# Patient Record
Sex: Male | Born: 1970
Health system: Southern US, Community
[De-identification: ages and names within clinical notes are randomized; demographics above are authoritative.]

## PROBLEM LIST (undated history)

## (undated) DIAGNOSIS — I10 Essential (primary) hypertension: Secondary | ICD-10-CM

## (undated) HISTORY — DX: Essential (primary) hypertension: I10

---

## 2014-11-13 DIAGNOSIS — I10 Essential (primary) hypertension: Secondary | ICD-10-CM | POA: Insufficient documentation

## 2014-11-13 DIAGNOSIS — F339 Major depressive disorder, recurrent, unspecified: Secondary | ICD-10-CM | POA: Insufficient documentation

## 2016-01-17 DIAGNOSIS — E669 Obesity, unspecified: Secondary | ICD-10-CM | POA: Insufficient documentation

## 2016-01-17 DIAGNOSIS — E66811 Obesity, class 1: Secondary | ICD-10-CM | POA: Insufficient documentation

## 2016-01-17 DIAGNOSIS — E538 Deficiency of other specified B group vitamins: Secondary | ICD-10-CM | POA: Insufficient documentation

## 2017-08-14 DIAGNOSIS — E782 Mixed hyperlipidemia: Secondary | ICD-10-CM | POA: Insufficient documentation

## 2019-05-30 DIAGNOSIS — R7989 Other specified abnormal findings of blood chemistry: Secondary | ICD-10-CM | POA: Diagnosis not present

## 2019-05-30 DIAGNOSIS — I10 Essential (primary) hypertension: Secondary | ICD-10-CM | POA: Diagnosis not present

## 2019-05-30 DIAGNOSIS — E7800 Pure hypercholesterolemia, unspecified: Secondary | ICD-10-CM | POA: Diagnosis not present

## 2019-05-30 DIAGNOSIS — F320 Major depressive disorder, single episode, mild: Secondary | ICD-10-CM | POA: Diagnosis not present

## 2019-05-30 DIAGNOSIS — Z23 Encounter for immunization: Secondary | ICD-10-CM | POA: Diagnosis not present

## 2019-05-30 DIAGNOSIS — E538 Deficiency of other specified B group vitamins: Secondary | ICD-10-CM | POA: Diagnosis not present

## 2019-11-28 DIAGNOSIS — E538 Deficiency of other specified B group vitamins: Secondary | ICD-10-CM | POA: Diagnosis not present

## 2019-11-28 DIAGNOSIS — E7800 Pure hypercholesterolemia, unspecified: Secondary | ICD-10-CM | POA: Diagnosis not present

## 2019-11-28 DIAGNOSIS — E669 Obesity, unspecified: Secondary | ICD-10-CM | POA: Diagnosis not present

## 2019-11-28 DIAGNOSIS — I10 Essential (primary) hypertension: Secondary | ICD-10-CM | POA: Diagnosis not present

## 2019-11-28 DIAGNOSIS — R002 Palpitations: Secondary | ICD-10-CM | POA: Diagnosis not present

## 2019-11-28 DIAGNOSIS — H539 Unspecified visual disturbance: Secondary | ICD-10-CM | POA: Diagnosis not present

## 2019-11-29 DIAGNOSIS — I472 Ventricular tachycardia: Secondary | ICD-10-CM | POA: Diagnosis not present

## 2019-12-25 DIAGNOSIS — I472 Ventricular tachycardia: Secondary | ICD-10-CM | POA: Diagnosis not present

## 2019-12-25 DIAGNOSIS — R008 Other abnormalities of heart beat: Secondary | ICD-10-CM | POA: Diagnosis not present

## 2020-01-02 DIAGNOSIS — I493 Ventricular premature depolarization: Secondary | ICD-10-CM | POA: Insufficient documentation

## 2020-01-02 DIAGNOSIS — R002 Palpitations: Secondary | ICD-10-CM | POA: Diagnosis not present

## 2020-01-02 DIAGNOSIS — M79671 Pain in right foot: Secondary | ICD-10-CM | POA: Diagnosis not present

## 2020-04-23 DIAGNOSIS — U071 COVID-19: Secondary | ICD-10-CM | POA: Diagnosis not present

## 2020-04-23 DIAGNOSIS — R6883 Chills (without fever): Secondary | ICD-10-CM | POA: Diagnosis not present

## 2020-08-31 DIAGNOSIS — I10 Essential (primary) hypertension: Secondary | ICD-10-CM | POA: Diagnosis not present

## 2020-08-31 DIAGNOSIS — E7800 Pure hypercholesterolemia, unspecified: Secondary | ICD-10-CM | POA: Diagnosis not present

## 2020-08-31 DIAGNOSIS — E538 Deficiency of other specified B group vitamins: Secondary | ICD-10-CM | POA: Diagnosis not present

## 2020-09-16 DIAGNOSIS — H2513 Age-related nuclear cataract, bilateral: Secondary | ICD-10-CM | POA: Diagnosis not present

## 2020-09-16 DIAGNOSIS — H40033 Anatomical narrow angle, bilateral: Secondary | ICD-10-CM | POA: Diagnosis not present

## 2021-02-01 ENCOUNTER — Other Ambulatory Visit: Payer: Self-pay | Admitting: Family Medicine

## 2021-02-01 ENCOUNTER — Other Ambulatory Visit: Payer: Self-pay

## 2021-02-01 ENCOUNTER — Ambulatory Visit: Payer: BC Managed Care – PPO | Admitting: Family Medicine

## 2021-02-01 ENCOUNTER — Encounter: Payer: Self-pay | Admitting: Family Medicine

## 2021-02-01 VITALS — BP 124/81 | HR 63 | Temp 98.4°F | Ht 74.0 in | Wt 270.0 lb

## 2021-02-01 DIAGNOSIS — E782 Mixed hyperlipidemia: Secondary | ICD-10-CM

## 2021-02-01 DIAGNOSIS — Z23 Encounter for immunization: Secondary | ICD-10-CM | POA: Diagnosis not present

## 2021-02-01 DIAGNOSIS — L03031 Cellulitis of right toe: Secondary | ICD-10-CM

## 2021-02-01 DIAGNOSIS — E669 Obesity, unspecified: Secondary | ICD-10-CM

## 2021-02-01 DIAGNOSIS — E538 Deficiency of other specified B group vitamins: Secondary | ICD-10-CM

## 2021-02-01 DIAGNOSIS — E66811 Obesity, class 1: Secondary | ICD-10-CM

## 2021-02-01 DIAGNOSIS — E8881 Metabolic syndrome: Secondary | ICD-10-CM | POA: Diagnosis not present

## 2021-02-01 DIAGNOSIS — I10 Essential (primary) hypertension: Secondary | ICD-10-CM | POA: Diagnosis not present

## 2021-02-01 DIAGNOSIS — S161XXA Strain of muscle, fascia and tendon at neck level, initial encounter: Secondary | ICD-10-CM

## 2021-02-01 MED ORDER — MUPIROCIN CALCIUM 2 % EX CREA: 1.0000 "application " | TOPICAL_CREAM | Freq: Two times a day (BID) | CUTANEOUS | 0 refills | Status: DC

## 2021-02-01 MED ORDER — OZEMPIC (0.25 OR 0.5 MG/DOSE) 2 MG/1.5ML ~~LOC~~ SOPN: 0.2500 mg | PEN_INJECTOR | SUBCUTANEOUS | 0 refills | Status: AC

## 2021-02-01 MED ORDER — PREDNISONE 20 MG PO TABS: 40.0000 mg | ORAL_TABLET | Freq: Every day | ORAL | 0 refills | Status: AC

## 2021-02-01 MED ORDER — LISINOPRIL-HYDROCHLOROTHIAZIDE 20-12.5 MG PO TABS: 2.0000 | ORAL_TABLET | Freq: Every day | ORAL | 1 refills | Status: DC

## 2021-02-01 NOTE — Patient Instructions (Signed)

## 2021-02-01 NOTE — Progress Notes (Signed)
Subjective:  Patient ID: Spencer Anderson, male    DOB: [birth year], 50 y.o.   MRN: [id number]  Patient Care Team: Baruch Gouty, FNP as PCP - General (Family Medicine)   Chief Complaint:  New Patient (Initial Visit) (Blood pressure Ferrel Logan toenail/Back, neck, shoulder pain)   HPI: Spencer Anderson is a 50 y.o. male presenting on 02/01/2021 for New Patient (Initial Visit) (Blood pressure Ferrel Logan toenail/Back, neck, shoulder pain)  Patient is a 50 year old Caucasian male who presents today to establish care with new PCP.  He was formally followed by [facility].  He states he had a disagreement with them in May of this year due to wanting to draw lab work. His primary care provider wanted to check renal function due to hypertension and medications.  Patient felt that his blood work should only be checked once a year at most. He did not agree with need for labs so he changed providers.  Patient aware lab work would be required for maintenance of chronic medications and chronic medical conditions at this office as well.  Patient verbalizes understanding and agrees to lab work today.  Patient has hypertension which has been well controlled on lisinopril/HCTZ.  He denies any chest pain, leg swelling, headaches, visual disturbances, dizziness, or confusion.  He does take medicines as prescribed.  He also has B12 deficiency and is on oral repletion therapy.  He was on injectable repletion therapy in the past but felt oral was sufficient.  He has a cholesterol as noted elevated on prior labs, he is not on medication for this and denies history of hyperlipidemia.  He is obese, hypertensive, and has elevated LDL at 140 consistent with metabolic syndrome.  He normally comes for management of chronic medical conditions he also complains of left lateral neck and left shoulder pain.  He states that he was helping his daughter move a dresser 2 to 3 months ago and pulled something in his shoulder and  neck area.  He states his neck feels tight and at times he has tingling sensations in his arm.  He did go to a chiropractor for about a month for symptoms, states this was not beneficial so he stopped going.  He has been taking Tylenol and Advil with some relief of symptoms.  No loss of function or decreased range of motion.  He also has a complaint of an ingrown toenail.  States this is a recurrent problem for him, this 1 has been present for over a month.  He has tried to cut the toenail out without success.  He has not been soaking the toe or using antibiotic ointment.   Relevant past medical, surgical, family, and social history reviewed and updated as indicated.  Allergies and medications reviewed and updated. Data reviewed: Chart in Epic.   Past Medical History:  Diagnosis Date   Hypertension     History reviewed. No pertinent surgical history.  Social History   Socioeconomic History   Marital status: Divorced    Spouse name: Not on file   Number of children: Not on file   Years of education: Not on file   Highest education level: Not on file  Occupational History   Not on file  Tobacco Use   Smoking status: Former    Packs/day: 1.00    Years: 22.00    Pack years: 22.00    Types: Cigarettes    Quit date: 01/12/2008    Years since quitting: 13.0    Passive  exposure: Past   Smokeless tobacco: Never  Vaping Use   Vaping Use: Never used  Substance and Sexual Activity   Alcohol use: Not Currently   Drug use: Never   Sexual activity: Not Currently  Other Topics Concern   Not on file  Social History Narrative   Not on file   Social Determinants of Health   Financial Resource Strain: Not on file  Food Insecurity: Not on file  Transportation Needs: Not on file  Physical Activity: Not on file  Stress: Not on file  Social Connections: Not on file  Intimate Partner Violence: Not on file    Outpatient Encounter Medications as of 02/01/2021  Medication Sig    cyanocobalamin 1000 MCG tablet Take by mouth.   mupirocin cream (BACTROBAN) 2 % Apply 1 application topically 2 (two) times daily.   predniSONE (DELTASONE) 20 MG tablet Take 2 tablets (40 mg total) by mouth daily with breakfast for 5 days.   Semaglutide,0.25 or 0.5MG/DOS, (OZEMPIC, 0.25 OR 0.5 MG/DOSE,) 2 MG/1.5ML SOPN Inject 0.25 mg into the skin once a week for 4 doses.   [DISCONTINUED] cyanocobalamin (,VITAMIN B-12,) 1000 MCG/ML injection Inject into the muscle.   [DISCONTINUED] lisinopril-hydrochlorothiazide (ZESTORETIC) 20-12.5 MG tablet Take 2 tablets by mouth daily.   lisinopril-hydrochlorothiazide (ZESTORETIC) 20-12.5 MG tablet Take 2 tablets by mouth daily.   No facility-administered encounter medications on file as of 02/01/2021.    No Known Allergies  Review of Systems  Constitutional:  Negative for activity change, appetite change, chills, diaphoresis, fatigue, fever and unexpected weight change.  HENT: Negative.    Eyes: Negative.  Negative for photophobia and visual disturbance.  Respiratory:  Negative for cough, chest tightness and shortness of breath.   Cardiovascular:  Negative for chest pain, palpitations and leg swelling.  Gastrointestinal:  Negative for abdominal pain, blood in stool, constipation, diarrhea, nausea and vomiting.  Endocrine: Negative.   Genitourinary:  Negative for decreased urine volume, difficulty urinating, dysuria, frequency and urgency.  Musculoskeletal:  Positive for arthralgias, myalgias, neck pain and neck stiffness. Negative for back pain, gait problem and joint swelling.  Skin:  Positive for color change. Negative for pallor and rash.  Allergic/Immunologic: Negative.   Neurological:  Negative for dizziness, tremors, seizures, syncope, facial asymmetry, speech difficulty, weakness, light-headedness, numbness and headaches.  Hematological: Negative.   Psychiatric/Behavioral:  Negative for confusion, hallucinations, sleep disturbance and suicidal  ideas.   All other systems reviewed and are negative.      Objective:  BP 124/81   Pulse 63   Temp 98.4 F (36.9 C)   Ht _0  (1.88 m)   Wt 270 lb (122.5 kg)   SpO2 95%   BMI 34.67 kg/m    Wt Readings from Last 3 Encounters:  02/01/21 270 lb (122.5 kg)    Physical Exam Vitals and nursing note reviewed.  Constitutional:      General: He is not in acute distress.    Appearance: Normal appearance. He is well-developed and well-groomed. He is morbidly obese. He is not ill-appearing, toxic-appearing or diaphoretic.  HENT:     Head: Normocephalic and atraumatic.     Jaw: There is normal jaw occlusion.     Right Ear: Hearing normal.     Left Ear: Hearing normal.     Nose: Nose normal.     Mouth/Throat:     Lips: Pink.     Mouth: Mucous membranes are moist.     Pharynx: Oropharynx is clear. Uvula midline.  Eyes:  General: Lids are normal.     Extraocular Movements: Extraocular movements intact.     Conjunctiva/sclera: Conjunctivae normal.     Pupils: Pupils are equal, round, and reactive to light.  Neck:     Thyroid: No thyroid mass, thyromegaly or thyroid tenderness.     Vascular: No carotid bruit or JVD.     Trachea: Trachea and phonation normal.  Cardiovascular:     Rate and Rhythm: Normal rate and regular rhythm.     Chest Wall: PMI is not displaced.     Pulses: Normal pulses.          Dorsalis pedis pulses are 2+ on the right side.       Posterior tibial pulses are 2+ on the right side.     Heart sounds: Normal heart sounds. No murmur heard.   No friction rub. No gallop.  Pulmonary:     Effort: Pulmonary effort is normal. No respiratory distress.     Breath sounds: Normal breath sounds. No wheezing.  Abdominal:     General: Bowel sounds are normal. There is no distension or abdominal bruit.     Palpations: Abdomen is soft. There is no hepatomegaly or splenomegaly.     Tenderness: There is no abdominal tenderness. There is no right CVA tenderness or left CVA  tenderness.     Hernia: No hernia is present.  Musculoskeletal:        General: No swelling, deformity or signs of injury. Normal range of motion.     Right shoulder: Normal.     Left shoulder: Normal.     Right upper arm: Normal.     Left upper arm: Normal.     Cervical back: Normal range of motion and neck supple. Spasms (left lateral neck) and tenderness (left laterall neck) present. No swelling, edema, deformity, erythema, signs of trauma, lacerations, rigidity, torticollis, bony tenderness or crepitus. Pain with movement (left lateral neck) present. Normal range of motion.     Thoracic back: Normal.       Back:     Right lower leg: No edema.     Left lower leg: No edema.     Right foot: Normal range of motion. No deformity, bunion, Charcot foot, foot drop or prominent metatarsal heads.       Feet:  Feet:     Right foot:     Skin integrity: Erythema and warmth present. No ulcer, blister, skin breakdown, callus, dry skin or fissure.     Toenail Condition: Right toenails are ingrown.  Lymphadenopathy:     Cervical: No cervical adenopathy.  Skin:    General: Skin is warm and dry.     Capillary Refill: Capillary refill takes less than 2 seconds.     Coloration: Skin is not cyanotic, jaundiced or pale.     Findings: No rash.  Neurological:     General: No focal deficit present.     Mental Status: He is alert and oriented to person, place, and time.     Cranial Nerves: No cranial nerve deficit.     Sensory: Sensation is intact. No sensory deficit.     Motor: Motor function is intact. No weakness.     Coordination: Coordination is intact. Coordination normal.     Gait: Gait is intact. Gait normal.     Deep Tendon Reflexes: Reflexes are normal and symmetric. Reflexes normal.  Psychiatric:        Attention and Perception: Attention and perception normal.  Mood and Affect: Mood and affect normal.        Speech: Speech normal.        Behavior: Behavior normal. Behavior is  cooperative.        Thought Content: Thought content normal.        Cognition and Memory: Cognition and memory normal.        Judgment: Judgment normal.    No results found for this or any previous visit.     Pertinent labs & imaging results that were available during my care of the patient were reviewed by me and considered in my medical decision making.  Assessment & Plan:  Nero was seen today for new patient (initial visit).  Diagnoses and all orders for this visit:  Essential hypertension BP well controlled. Changes were not made in regimen today. Goal BP is 140/90. Pt aware to report any persistent high or low readings. DASH diet and exercise encouraged. Exercise at least 150 minutes per week and increase as tolerated. Goal BMI > 25. Stress management encouraged. Avoid nicotine and tobacco product use. Avoid excessive alcohol and NSAID's. Avoid more than 2000 mg of sodium daily. Medications as prescribed. Follow up as scheduled. Labs pending.  Follow-up in 4 weeks for reevaluation. -     CBC with Differential/Platelet -     CMP14+EGFR -     Lipid panel -     Thyroid Panel With TSH -     Microalbumin / creatinine urine ratio -     Semaglutide,0.25 or 0.5MG/DOS, (OZEMPIC, 0.25 OR 0.5 MG/DOSE,) 2 MG/1.5ML SOPN; Inject 0.25 mg into the skin once a week for 4 doses. -     lisinopril-hydrochlorothiazide (ZESTORETIC) 20-12.5 MG tablet; Take 2 tablets by mouth daily.  Mixed hyperlipidemia We will recheck lipids today and if still elevated will start statin therapy.  Diet and exercise encouraged. -     CMP14+EGFR -     Lipid panel  Obesity, Class I, BMI 30-34.9 Diet and exercise encouraged.  Labs pending.  Will initiate Ozempic as prescribed for obesity and metabolic syndrome.  Follow-up in 4 weeks for reevaluation. -     CBC with Differential/Platelet -     CMP14+EGFR -     Lipid panel -     Thyroid Panel With TSH -     Semaglutide,0.25 or 0.5MG/DOS, (OZEMPIC, 0.25 OR 0.5  MG/DOSE,) 2 MG/1.5ML SOPN; Inject 0.25 mg into the skin once a week for 4 doses.  Metabolic syndrome Diet and exercise encouraged.  Labs pending.  Will initiate Ozempic for obesity and metabolic syndrome.  Follow-up in 4 weeks for reevaluation. -     CBC with Differential/Platelet -     CMP14+EGFR -     Lipid panel -     Thyroid Panel With TSH -     Semaglutide,0.25 or 0.5MG/DOS, (OZEMPIC, 0.25 OR 0.5 MG/DOSE,) 2 MG/1.5ML SOPN; Inject 0.25 mg into the skin once a week for 4 doses.  B12 deficiency Will check levels today to see if IM repletion therapy is warranted. -     CBC with Differential/Platelet -     Vitamin B12  Strain of cervical portion of left trapezius muscle Spasm noted to left trapezius muscle.  Symptomatic care discussed in detail.  Prednisone as prescribed.  Moist heat.  Topicals such as Biofreeze or icy hot.  Strengthening and stretching exercises discussed.  Report any new, worsening, or persistent symptoms. -     predniSONE (DELTASONE) 20 MG tablet; Take 2 tablets (  40 mg total) by mouth daily with breakfast for 5 days.  Paronychia of great toe, right Will treat with soaks and topical Bactroban.  Patient to return to office for toenail removal if symptoms persist. -     mupirocin cream (BACTROBAN) 2 %; Apply 1 application topically 2 (two) times daily.  Need for immunization against influenza -     Flu Vaccine QUAD 79moIM (Fluarix, Fluzone & Alfiuria Quad PF)    Continue all other maintenance medications.  Follow up plan: Return in about 4 weeks (around 03/01/2021), or if symptoms worsen or fail to improve, for BMI, HTN.   Continue healthy lifestyle choices, including diet (rich in fruits, vegetables, and lean proteins, and low in salt and simple carbohydrates) and exercise (at least 30 minutes of moderate physical activity daily).  Educational handout given for DASH diet  The above assessment and management plan was discussed with the patient. The patient  verbalized understanding of and has agreed to the management plan. Patient is aware to call the clinic if they develop any new symptoms or if symptoms persist or worsen. Patient is aware when to return to the clinic for a follow-up visit. Patient educated on when it is appropriate to go to the emergency department.   MMonia Pouch FNP-C [facility] [phone]

## 2021-02-02 LAB — CMP14+EGFR
ALT: 40 IU/L (ref 0–44)
AST: 26 IU/L (ref 0–40)
Albumin/Globulin Ratio: 2.1 (ref 1.2–2.2)
Albumin: 4.9 g/dL (ref 4.0–5.0)
Alkaline Phosphatase: 89 IU/L (ref 44–121)
BUN/Creatinine Ratio: 14 (ref 9–20)
BUN: 14 mg/dL (ref 6–24)
Bilirubin Total: 0.3 mg/dL (ref 0.0–1.2)
CO2: 25 mmol/L (ref 20–29)
Calcium: 9.5 mg/dL (ref 8.7–10.2)
Chloride: 99 mmol/L (ref 96–106)
Creatinine, Ser: 0.97 mg/dL (ref 0.76–1.27)
Globulin, Total: 2.3 g/dL (ref 1.5–4.5)
Glucose: 88 mg/dL (ref 70–99)
Potassium: 4 mmol/L (ref 3.5–5.2)
Sodium: 141 mmol/L (ref 134–144)
Total Protein: 7.2 g/dL (ref 6.0–8.5)
eGFR: 96 mL/min/{1.73_m2} (ref >59)

## 2021-02-02 LAB — MICROALBUMIN / CREATININE URINE RATIO
Creatinine, Urine: 62.1 mg/dL
Microalb/Creat Ratio: <5 mg/g creat (ref 0–29)
Microalbumin, Urine: <3 ug/mL

## 2021-02-02 LAB — CBC WITH DIFFERENTIAL/PLATELET
Basophils Absolute: 0.1 10*3/uL (ref 0.0–0.2)
Basos: 1 %
EOS (ABSOLUTE): 0.3 10*3/uL (ref 0.0–0.4)
Eos: 3 %
Hematocrit: 40.2 % (ref 37.5–51.0)
Hemoglobin: 14.2 g/dL (ref 13.0–17.7)
Immature Grans (Abs): 0.1 10*3/uL (ref 0.0–0.1)
Immature Granulocytes: 1 %
Lymphocytes Absolute: 2.2 10*3/uL (ref 0.7–3.1)
Lymphs: 29 %
MCH: 30.5 pg (ref 26.6–33.0)
MCHC: 35.3 g/dL (ref 31.5–35.7)
MCV: 86 fL (ref 79–97)
Monocytes Absolute: 0.6 10*3/uL (ref 0.1–0.9)
Monocytes: 9 %
Neutrophils Absolute: 4.2 10*3/uL (ref 1.4–7.0)
Neutrophils: 57 %
Platelets: 285 10*3/uL (ref 150–450)
RBC: 4.66 x10E6/uL (ref 4.14–5.80)
RDW: 12.5 % (ref 11.6–15.4)
WBC: 7.4 10*3/uL (ref 3.4–10.8)

## 2021-02-02 LAB — LIPID PANEL
Chol/HDL Ratio: 4.9 ratio (ref 0.0–5.0)
Cholesterol, Total: 188 mg/dL (ref 100–199)
HDL: 38 mg/dL — ABNORMAL LOW (ref >39)
LDL Chol Calc (NIH): 128 mg/dL — ABNORMAL HIGH (ref 0–99)
Triglycerides: 123 mg/dL (ref 0–149)
VLDL Cholesterol Cal: 22 mg/dL (ref 5–40)

## 2021-02-02 LAB — THYROID PANEL WITH TSH
Free Thyroxine Index: 1.9 (ref 1.2–4.9)
T3 Uptake Ratio: 25 % (ref 24–39)
T4, Total: 7.4 ug/dL (ref 4.5–12.0)
TSH: 1.59 u[IU]/mL (ref 0.450–4.500)

## 2021-02-02 LAB — VITAMIN B12: Vitamin B-12: 863 pg/mL (ref 232–1245)

## 2021-02-11 ENCOUNTER — Telehealth: Payer: Self-pay | Admitting: Family Medicine

## 2021-02-11 NOTE — Telephone Encounter (Signed)
Pt called to let Dr Reginia Forts know that while he was taking the Prednisone that she prescribed him, it did help with his pain and issue but says now its worse than it was and needs advise.  Please advise and call patient ASAP.

## 2021-02-12 NOTE — Telephone Encounter (Signed)
Pt aware.

## 2021-02-14 ENCOUNTER — Ambulatory Visit (INDEPENDENT_AMBULATORY_CARE_PROVIDER_SITE_OTHER): Payer: BC Managed Care – PPO

## 2021-02-14 ENCOUNTER — Other Ambulatory Visit: Payer: Self-pay

## 2021-02-14 ENCOUNTER — Encounter: Payer: Self-pay | Admitting: Family Medicine

## 2021-02-14 ENCOUNTER — Ambulatory Visit: Payer: BC Managed Care – PPO | Admitting: Family Medicine

## 2021-02-14 VITALS — BP 128/91 | HR 73 | Temp 98.0°F | Ht 74.0 in | Wt 263.0 lb

## 2021-02-14 DIAGNOSIS — M5412 Radiculopathy, cervical region: Secondary | ICD-10-CM

## 2021-02-14 DIAGNOSIS — M47812 Spondylosis without myelopathy or radiculopathy, cervical region: Secondary | ICD-10-CM | POA: Diagnosis not present

## 2021-02-14 MED ORDER — KETOROLAC TROMETHAMINE 30 MG/ML IJ SOLN
30.0000 mg | Freq: Once | INTRAMUSCULAR | Status: AC
  Administered 2021-02-14: 30 mg via INTRAMUSCULAR

## 2021-02-14 MED ORDER — PREDNISONE 10 MG (21) PO TBPK: ORAL_TABLET | ORAL | 0 refills | Status: DC

## 2021-02-14 NOTE — Progress Notes (Signed)
   Subjective:  Patient ID: Spencer Anderson, male    DOB: 04/08/1971, 50 y.o.   MRN: [id number]  Patient Care Team: Rakes, Linda M, FNP as PCP - General (Family Medicine)   Chief Complaint:  Arm Pain (Left side neck, shoulder, arm, hand, finger//Numbness, tingling, pins & needles)   HPI: Spencer Anderson is a 50 y.o. male presenting on 02/14/2021 for Arm Pain (Left side neck, shoulder, arm, hand, finger//Numbness, tingling, pins & needles)  Patient presents today with ongoing left-sided neck pain that radiates into his left arm.  This has been ongoing for several months.  Symptoms worsened after moving furniture for his daughter.  He was seen by chiropractor and treated for about a month without resolution of symptoms.  Upon initial visit to this office he was placed on a steroid burst which did help symptoms.  States symptoms returned and seem to be progressing.  He denies any new injuries, loss of function, weakness in arms, fever, chills, confusion, or fatigue.  Imaging from chiropractor office was requested but has not been received.  Patient reports these images were abnormal.  He has not been taking anything at home for the pain as Motrin is not beneficial.  States pain interferes with sleep and is a 6-8 out of 10 at times.   Relevant past medical, surgical, family, and social history reviewed and updated as indicated.  Allergies and medications reviewed and updated. Data reviewed: Chart in Epic.   Past Medical History:  Diagnosis Date   Hypertension     History reviewed. No pertinent surgical history.  Social History   Socioeconomic History   Marital status: Divorced    Spouse name: Not on file   Number of children: Not on file   Years of education: Not on file   Highest education level: Not on file  Occupational History   Not on file  Tobacco Use   Smoking status: Former    Packs/day: 1.00    Years: 22.00    Pack years: 22.00    Types: Cigarettes    Quit date:  01/12/2008    Years since quitting: 13.1    Passive exposure: Past   Smokeless tobacco: Never  Vaping Use   Vaping Use: Never used  Substance and Sexual Activity   Alcohol use: Not Currently   Drug use: Never   Sexual activity: Not Currently  Other Topics Concern   Not on file  Social History Narrative   Not on file   Social Determinants of Health   Financial Resource Strain: Not on file  Food Insecurity: Not on file  Transportation Needs: Not on file  Physical Activity: Not on file  Stress: Not on file  Social Connections: Not on file  Intimate Partner Violence: Not on file    Outpatient Encounter Medications as of 02/14/2021  Medication Sig   cyanocobalamin 1000 MCG tablet Take by mouth.   lisinopril-hydrochlorothiazide (ZESTORETIC) 20-12.5 MG tablet Take 2 tablets by mouth daily.   mupirocin ointment (BACTROBAN) 2 % Apply topically 2 (two) times daily.   predniSONE (STERAPRED UNI-PAK 21 TAB) 10 MG (21) TBPK tablet As directed x 6 days   Semaglutide,0.25 or 0.5MG/DOS, (OZEMPIC, 0.25 OR 0.5 MG/DOSE,) 2 MG/1.5ML SOPN Inject 0.25 mg into the skin once a week for 4 doses.   [EXPIRED] ketorolac (TORADOL) 30 MG/ML injection 30 mg    No facility-administered encounter medications on file as of 02/14/2021.    No Known Allergies  Review of Systems  Constitutional:    Negative for activity change, appetite change, chills, diaphoresis, fatigue, fever and unexpected weight change.  HENT: Negative.    Eyes: Negative.   Respiratory:  Negative for cough, chest tightness and shortness of breath.   Cardiovascular:  Negative for chest pain, palpitations and leg swelling.  Gastrointestinal:  Negative for abdominal pain, blood in stool, constipation, diarrhea, nausea and vomiting.  Endocrine: Negative.   Genitourinary:  Negative for decreased urine volume, difficulty urinating, dysuria, frequency and urgency.  Musculoskeletal:  Positive for arthralgias, neck pain and neck stiffness.  Negative for back pain, gait problem, joint swelling and myalgias.  Skin: Negative.   Allergic/Immunologic: Negative.   Neurological:  Negative for dizziness, tremors, seizures, syncope, facial asymmetry, speech difficulty, weakness, light-headedness, numbness and headaches.  Hematological: Negative.   Psychiatric/Behavioral:  Negative for confusion, hallucinations, sleep disturbance and suicidal ideas.   All other systems reviewed and are negative.      Objective:  BP (!) 128/91   Pulse 73   Temp 98 F (36.7 C)   Ht 6' 2" (1.88 m)   Wt 263 lb (119.3 kg)   SpO2 97%   BMI 33.77 kg/m    Wt Readings from Last 3 Encounters:  02/14/21 263 lb (119.3 kg)  02/01/21 270 lb (122.5 kg)    Physical Exam Vitals and nursing note reviewed.  Constitutional:      General: He is not in acute distress.    Appearance: Normal appearance. He is well-developed and well-groomed. He is obese. He is not ill-appearing, toxic-appearing or diaphoretic.  HENT:     Head: Normocephalic and atraumatic.     Jaw: There is normal jaw occlusion.     Right Ear: Hearing normal.     Left Ear: Hearing normal.     Nose: Nose normal.     Mouth/Throat:     Lips: Pink.     Mouth: Mucous membranes are moist.     Pharynx: Oropharynx is clear. Uvula midline.  Eyes:     General: Lids are normal.     Extraocular Movements: Extraocular movements intact.     Conjunctiva/sclera: Conjunctivae normal.     Pupils: Pupils are equal, round, and reactive to light.  Neck:     Thyroid: No thyroid mass, thyromegaly or thyroid tenderness.     Vascular: No carotid bruit or JVD.     Trachea: Trachea and phonation normal.  Cardiovascular:     Rate and Rhythm: Normal rate and regular rhythm.     Chest Wall: PMI is not displaced.     Pulses: Normal pulses.     Heart sounds: Normal heart sounds. No murmur heard.   No friction rub. No gallop.  Pulmonary:     Effort: Pulmonary effort is normal. No respiratory distress.      Breath sounds: Normal breath sounds. No wheezing.  Abdominal:     General: There is no abdominal bruit.     Palpations: There is no hepatomegaly or splenomegaly.  Musculoskeletal:     Right shoulder: Normal.     Left shoulder: Tenderness present. No swelling, deformity, effusion, laceration, bony tenderness or crepitus. Decreased range of motion. Normal strength. Normal pulse.     Right upper arm: Normal.     Left upper arm: Normal.       Arms:     Cervical back: Neck supple. Tenderness (Left lateral neck) present. No swelling, edema, deformity, erythema, signs of trauma, lacerations, rigidity, spasms, torticollis, bony tenderness or crepitus. Pain with movement present. Decreased range of   motion.     Thoracic back: Normal.     Right lower leg: No edema.     Left lower leg: No edema.  Lymphadenopathy:     Cervical: No cervical adenopathy.  Skin:    General: Skin is warm and dry.     Capillary Refill: Capillary refill takes less than 2 seconds.     Coloration: Skin is not cyanotic, jaundiced or pale.     Findings: No rash.  Neurological:     General: No focal deficit present.     Mental Status: He is alert and oriented to person, place, and time.     Sensory: Sensation is intact.     Motor: Motor function is intact.     Coordination: Coordination is intact.     Gait: Gait is intact.     Deep Tendon Reflexes: Reflexes are normal and symmetric.  Psychiatric:        Attention and Perception: Attention and perception normal.        Mood and Affect: Mood and affect normal.        Speech: Speech normal.        Behavior: Behavior normal. Behavior is cooperative.        Thought Content: Thought content normal.        Cognition and Memory: Cognition and memory normal.        Judgment: Judgment normal.    Results for orders placed or performed in visit on 02/01/21  CBC with Differential/Platelet  Result Value Ref Range   WBC 7.4 3.4 - 10.8 x10E3/uL   RBC 4.66 4.14 - 5.80 x10E6/uL    Hemoglobin 14.2 13.0 - 17.7 g/dL   Hematocrit 40.2 37.5 - 51.0 %   MCV 86 79 - 97 fL   MCH 30.5 26.6 - 33.0 pg   MCHC 35.3 31.5 - 35.7 g/dL   RDW 12.5 11.6 - 15.4 %   Platelets 285 150 - 450 x10E3/uL   Neutrophils 57 Not Estab. %   Lymphs 29 Not Estab. %   Monocytes 9 Not Estab. %   Eos 3 Not Estab. %   Basos 1 Not Estab. %   Neutrophils Absolute 4.2 1.4 - 7.0 x10E3/uL   Lymphocytes Absolute 2.2 0.7 - 3.1 x10E3/uL   Monocytes Absolute 0.6 0.1 - 0.9 x10E3/uL   EOS (ABSOLUTE) 0.3 0.0 - 0.4 x10E3/uL   Basophils Absolute 0.1 0.0 - 0.2 x10E3/uL   Immature Granulocytes 1 Not Estab. %   Immature Grans (Abs) 0.1 0.0 - 0.1 x10E3/uL  CMP14+EGFR  Result Value Ref Range   Glucose 88 70 - 99 mg/dL   BUN 14 6 - 24 mg/dL   Creatinine, Ser 0.97 0.76 - 1.27 mg/dL   eGFR 96 >59 mL/min/1.73   BUN/Creatinine Ratio 14 9 - 20   Sodium 141 134 - 144 mmol/L   Potassium 4.0 3.5 - 5.2 mmol/L   Chloride 99 96 - 106 mmol/L   CO2 25 20 - 29 mmol/L   Calcium 9.5 8.7 - 10.2 mg/dL   Total Protein 7.2 6.0 - 8.5 g/dL   Albumin 4.9 4.0 - 5.0 g/dL   Globulin, Total 2.3 1.5 - 4.5 g/dL   Albumin/Globulin Ratio 2.1 1.2 - 2.2   Bilirubin Total 0.3 0.0 - 1.2 mg/dL   Alkaline Phosphatase 89 44 - 121 IU/L   AST 26 0 - 40 IU/L   ALT 40 0 - 44 IU/L  Lipid panel  Result Value Ref Range   Cholesterol, Total 188   100 - 199 mg/dL   Triglycerides 123 0 - 149 mg/dL   HDL 38 (L) >39 mg/dL   VLDL Cholesterol Cal 22 5 - 40 mg/dL   LDL Chol Calc (NIH) 128 (H) 0 - 99 mg/dL   Chol/HDL Ratio 4.9 0.0 - 5.0 ratio  Thyroid Panel With TSH  Result Value Ref Range   TSH 1.590 0.450 - 4.500 uIU/mL   T4, Total 7.4 4.5 - 12.0 ug/dL   T3 Uptake Ratio 25 24 - 39 %   Free Thyroxine Index 1.9 1.2 - 4.9  Microalbumin / creatinine urine ratio  Result Value Ref Range   Creatinine, Urine 62.1 Not Estab. mg/dL   Microalbumin, Urine <3.0 Not Estab. ug/mL   Microalb/Creat Ratio <5 0 - 29 mg/g creat  Vitamin B12  Result Value Ref Range    Vitamin B-12 863 232 - 1,245 pg/mL     X-Ray: C-Spine: arthritic changes and disc space narrowing. Preliminary x-ray reading by Monia Pouch, FNP-C, [facility].   Pertinent labs & imaging results that were available during my care of the patient were reviewed by me and considered in my medical decision making.  Assessment & Plan:  Montrelle was seen today for arm pain.  Diagnoses and all orders for this visit:  Cervical radiculopathy C-spine imaging in the office today reveals degenerative changes with disc space narrowing.  Referral to neurosurgery placed.  Patient will likely need an MRI.  Will give IM Toradol today and provide a steroid Dosepak for symptom relief.  Patient aware of symptoms which require immediate evaluation and treatment.  Report any new, worsening, or persistent symptoms.  Follow-up in 6 weeks or sooner if needed. -     DG Cervical Spine Complete -     Ambulatory referral to Neurosurgery -     predniSONE (STERAPRED UNI-PAK 21 TAB) 10 MG (21) TBPK tablet; As directed x 6 days -     ketorolac (TORADOL) 30 MG/ML injection 30 mg     Continue all other maintenance medications.  Follow up plan: Return if symptoms worsen or fail to improve.   Continue healthy lifestyle choices, including diet (rich in fruits, vegetables, and lean proteins, and low in salt and simple carbohydrates) and exercise (at least 30 minutes of moderate physical activity daily).  Educational handout given for cervical radiculopathy  The above assessment and management plan was discussed with the patient. The patient verbalized understanding of and has agreed to the management plan. Patient is aware to call the clinic if they develop any new symptoms or if symptoms persist or worsen. Patient is aware when to return to the clinic for a follow-up visit. Patient educated on when it is appropriate to go to the emergency department.   Monia Pouch, FNP-C [facility] [phone]

## 2021-02-25 ENCOUNTER — Other Ambulatory Visit: Payer: Self-pay | Admitting: Family Medicine

## 2021-02-25 DIAGNOSIS — M5412 Radiculopathy, cervical region: Secondary | ICD-10-CM

## 2021-02-25 NOTE — Telephone Encounter (Signed)
Please advise on prednisone RF Rxd on 02/14/21 Next OV 03/05/21

## 2021-02-25 NOTE — Telephone Encounter (Signed)
REFERRAL REQUEST Telephone Note  Have you been seen at our office for this problem? yes (Advise that they may need an appointment with their PCP before a referral can be done)  Reason for Referral: neck/pain area painful Referral discussed with patient: yes  Best contact number of patient for referral team: [phone]    Has patient been seen by a specialist for this issue before: no  Patient provider preference for referral: first available Patient location preference for referral: first available   Patient notified that referrals can take up to a week or longer to process. If they haven't heard anything within a week they should call back and speak with the referral department.    Neurologist--wants MRI done.  Rakes' pt.  Pt is still in a lot of pain & he wants another predisone called in until he can get an referral appt. Please send into [facility].  He doesn't want any pain bc of driving back & forth.

## 2021-02-26 ENCOUNTER — Telehealth: Payer: Self-pay | Admitting: Family Medicine

## 2021-02-27 NOTE — Telephone Encounter (Signed)
Patient came in with his dad for his appt and asked if his prednisone had been called in. Told him Marcelino Duster hadn't addressed it.

## 2021-02-28 ENCOUNTER — Telehealth: Payer: Self-pay | Admitting: Family Medicine

## 2021-02-28 NOTE — Telephone Encounter (Signed)
  Prescription Request  02/28/2021  Is this a "Controlled Substance" medicine? no  Have you seen your PCP in the last 2 weeks?02/14/2021  If YES, route message to pool  -  If NO, patient needs to be scheduled for appointment.  What is the name of the medication or equipment? Pt finished prednizone and is waiting to see neuosurgeon and get his mri. Pt wants to know if Marcelino Duster will call him in   Have you contacted your pharmacy to request a refill? no   Which pharmacy would you like this sent to? [facility]    Patient notified that their request is being sent to the clinical staff for review and that they should receive a response within 2 business days.

## 2021-03-01 ENCOUNTER — Other Ambulatory Visit: Payer: Self-pay | Admitting: Family Medicine

## 2021-03-01 DIAGNOSIS — M5412 Radiculopathy, cervical region: Secondary | ICD-10-CM

## 2021-03-01 MED ORDER — PREDNISONE 10 MG (21) PO TBPK: ORAL_TABLET | ORAL | 0 refills | Status: DC

## 2021-03-01 NOTE — Telephone Encounter (Signed)
Pt finished prednizone and is waiting to see neuosurgeon and get his mri. Pt wants to know if Marcelino Duster will call him in more until apt.  Covering pcp- please advise

## 2021-03-01 NOTE — Telephone Encounter (Signed)
Attempted to contact patient - NA Need more information

## 2021-03-01 NOTE — Telephone Encounter (Signed)
Attempted to contact patient- mailbox full  

## 2021-03-01 NOTE — Telephone Encounter (Signed)
Refill x1 sent 

## 2021-03-05 ENCOUNTER — Other Ambulatory Visit: Payer: Self-pay

## 2021-03-05 ENCOUNTER — Encounter: Payer: Self-pay | Admitting: Family Medicine

## 2021-03-05 ENCOUNTER — Ambulatory Visit: Payer: BC Managed Care – PPO | Admitting: Family Medicine

## 2021-03-05 VITALS — BP 119/78 | HR 83 | Temp 97.7°F | Ht 74.0 in | Wt 263.0 lb

## 2021-03-05 DIAGNOSIS — L600 Ingrowing nail: Secondary | ICD-10-CM | POA: Diagnosis not present

## 2021-03-05 DIAGNOSIS — M5412 Radiculopathy, cervical region: Secondary | ICD-10-CM

## 2021-03-05 MED ORDER — NAPROXEN 500 MG PO TABS: 500.0000 mg | ORAL_TABLET | Freq: Two times a day (BID) | ORAL | 0 refills | Status: DC

## 2021-03-05 NOTE — Telephone Encounter (Signed)
  Patient seen in clinic today 03/05/21 by Kari Baars

## 2021-03-05 NOTE — Progress Notes (Signed)
Subjective:  Patient ID: Spencer Anderson, male    DOB: 1970-07-09, 50 y.o.   MRN: [id number]  Patient Care Team: Baruch Gouty, FNP as PCP - General (Family Medicine)   Chief Complaint:  Nail Problem and Shoulder Pain   HPI: Spencer Anderson is a 50 y.o. male presenting on 03/05/2021 for Nail Problem and Shoulder Pain   Patient presents today for reevaluation of right great ingrown toenail.  At previous visit toe was very inflamed with purulent discharge.  Patient was prescribed topical Bactroban and educated on wound care.  He returns today for removal of ingrown toenail.  States erythema, pain, swelling, and drainage have improved greatly since initial visit.  No fever, chills, weakness, confusion, or other signs of systemic infection. He reports ongoing left shoulder pain.  States steroids helped tremendously but as soon as he completes dosing the pain returns.  He has been referred to Ortho and is awaiting an appointment.    Relevant past medical, surgical, family, and social history reviewed and updated as indicated.  Allergies and medications reviewed and updated. Data reviewed: Chart in Epic.   Past Medical History:  Diagnosis Date   Hypertension     History reviewed. No pertinent surgical history.  Social History   Socioeconomic History   Marital status: Divorced    Spouse name: Not on file   Number of children: Not on file   Years of education: Not on file   Highest education level: Not on file  Occupational History   Not on file  Tobacco Use   Smoking status: Former    Packs/day: 1.00    Years: 22.00    Pack years: 22.00    Types: Cigarettes    Quit date: 01/12/2008    Years since quitting: 13.1    Passive exposure: Past   Smokeless tobacco: Never  Vaping Use   Vaping Use: Never used  Substance and Sexual Activity   Alcohol use: Not Currently   Drug use: Never   Sexual activity: Not Currently  Other Topics Concern   Not on file  Social History  Narrative   Not on file   Social Determinants of Health   Financial Resource Strain: Not on file  Food Insecurity: Not on file  Transportation Needs: Not on file  Physical Activity: Not on file  Stress: Not on file  Social Connections: Not on file  Intimate Partner Violence: Not on file    Outpatient Encounter Medications as of 03/05/2021  Medication Sig   cyanocobalamin 1000 MCG tablet Take by mouth.   lisinopril-hydrochlorothiazide (ZESTORETIC) 20-12.5 MG tablet Take 2 tablets by mouth daily.   mupirocin ointment (BACTROBAN) 2 % Apply topically 2 (two) times daily.   naproxen (NAPROSYN) 500 MG tablet Take 1 tablet (500 mg total) by mouth 2 (two) times daily with a meal.   predniSONE (STERAPRED UNI-PAK 21 TAB) 10 MG (21) TBPK tablet As directed x 6 days   No facility-administered encounter medications on file as of 03/05/2021.    No Known Allergies  Review of Systems  Constitutional:  Negative for activity change, appetite change, chills, diaphoresis, fatigue, fever and unexpected weight change.  HENT: Negative.    Eyes: Negative.   Respiratory:  Negative for cough, chest tightness and shortness of breath.   Cardiovascular:  Negative for chest pain, palpitations and leg swelling.  Gastrointestinal:  Negative for abdominal pain, blood in stool, constipation, diarrhea, nausea and vomiting.  Endocrine: Negative.   Genitourinary:  Negative for  decreased urine volume, difficulty urinating, dysuria, frequency and urgency.  Musculoskeletal:  Positive for arthralgias. Negative for myalgias.  Skin:        Right great toe ingrown toenail  Allergic/Immunologic: Negative.   Neurological:  Negative for dizziness, weakness and headaches.  Hematological: Negative.   Psychiatric/Behavioral:  Negative for confusion, hallucinations, sleep disturbance and suicidal ideas.   All other systems reviewed and are negative.      Objective:  BP 119/78   Pulse 83   Temp 97.7 F (36.5 C)   Ht  '6\' 2"'  (1.88 m)   Wt 263 lb (119.3 kg)   SpO2 99%   BMI 33.77 kg/m    Wt Readings from Last 3 Encounters:  03/05/21 263 lb (119.3 kg)  02/14/21 263 lb (119.3 kg)  02/01/21 270 lb (122.5 kg)    Physical Exam Vitals and nursing note reviewed.  Constitutional:      Appearance: Normal appearance. He is obese.  HENT:     Head: Normocephalic and atraumatic.     Mouth/Throat:     Mouth: Mucous membranes are moist.  Eyes:     Conjunctiva/sclera: Conjunctivae normal.     Pupils: Pupils are equal, round, and reactive to light.  Cardiovascular:     Rate and Rhythm: Normal rate and regular rhythm.     Pulses: Normal pulses.          Dorsalis pedis pulses are 2+ on the right side.       Posterior tibial pulses are 2+ on the right side.     Heart sounds: Normal heart sounds.  Pulmonary:     Effort: Pulmonary effort is normal.     Breath sounds: Normal breath sounds.  Musculoskeletal:     Left shoulder: Tenderness present. No swelling, deformity, effusion, laceration, bony tenderness or crepitus. Decreased range of motion. Normal strength. Normal pulse.     Left upper arm: Normal.     Cervical back: Normal.  Feet:     Right foot:     Protective Sensation: 10 sites tested.  10 sites sensed.     Skin integrity: Erythema present.     Toenail Condition: Right toenails are ingrown.     Comments: Right great toe with ingrown nail, has been pretreated with topical antibiotics with improvement of erythema. No drainage. Skin:    General: Skin is warm and dry.     Capillary Refill: Capillary refill takes less than 2 seconds.  Neurological:     General: No focal deficit present.     Mental Status: He is alert and oriented to person, place, and time.  Psychiatric:        Mood and Affect: Mood normal.        Behavior: Behavior normal.        Thought Content: Thought content normal.        Judgment: Judgment normal.    Results for orders placed or performed in visit on 02/01/21  CBC with  Differential/Platelet  Result Value Ref Range   WBC 7.4 3.4 - 10.8 x10E3/uL   RBC 4.66 4.14 - 5.80 x10E6/uL   Hemoglobin 14.2 13.0 - 17.7 g/dL   Hematocrit 40.2 37.5 - 51.0 %   MCV 86 79 - 97 fL   MCH 30.5 26.6 - 33.0 pg   MCHC 35.3 31.5 - 35.7 g/dL   RDW 12.5 11.6 - 15.4 %   Platelets 285 150 - 450 x10E3/uL   Neutrophils 57 Not Estab. %   Lymphs 29 Not  Estab. %   Monocytes 9 Not Estab. %   Eos 3 Not Estab. %   Basos 1 Not Estab. %   Neutrophils Absolute 4.2 1.4 - 7.0 x10E3/uL   Lymphocytes Absolute 2.2 0.7 - 3.1 x10E3/uL   Monocytes Absolute 0.6 0.1 - 0.9 x10E3/uL   EOS (ABSOLUTE) 0.3 0.0 - 0.4 x10E3/uL   Basophils Absolute 0.1 0.0 - 0.2 x10E3/uL   Immature Granulocytes 1 Not Estab. %   Immature Grans (Abs) 0.1 0.0 - 0.1 x10E3/uL  CMP14+EGFR  Result Value Ref Range   Glucose 88 70 - 99 mg/dL   BUN 14 6 - 24 mg/dL   Creatinine, Ser 0.97 0.76 - 1.27 mg/dL   eGFR 96 >59 mL/min/1.73   BUN/Creatinine Ratio 14 9 - 20   Sodium 141 134 - 144 mmol/L   Potassium 4.0 3.5 - 5.2 mmol/L   Chloride 99 96 - 106 mmol/L   CO2 25 20 - 29 mmol/L   Calcium 9.5 8.7 - 10.2 mg/dL   Total Protein 7.2 6.0 - 8.5 g/dL   Albumin 4.9 4.0 - 5.0 g/dL   Globulin, Total 2.3 1.5 - 4.5 g/dL   Albumin/Globulin Ratio 2.1 1.2 - 2.2   Bilirubin Total 0.3 0.0 - 1.2 mg/dL   Alkaline Phosphatase 89 44 - 121 IU/L   AST 26 0 - 40 IU/L   ALT 40 0 - 44 IU/L  Lipid panel  Result Value Ref Range   Cholesterol, Total 188 100 - 199 mg/dL   Triglycerides 123 0 - 149 mg/dL   HDL 38 (L) >39 mg/dL   VLDL Cholesterol Cal 22 5 - 40 mg/dL   LDL Chol Calc (NIH) 128 (H) 0 - 99 mg/dL   Chol/HDL Ratio 4.9 0.0 - 5.0 ratio  Thyroid Panel With TSH  Result Value Ref Range   TSH 1.590 0.450 - 4.500 uIU/mL   T4, Total 7.4 4.5 - 12.0 ug/dL   T3 Uptake Ratio 25 24 - 39 %   Free Thyroxine Index 1.9 1.2 - 4.9  Microalbumin / creatinine urine ratio  Result Value Ref Range   Creatinine, Urine 62.1 Not Estab. mg/dL    Microalbumin, Urine <3.0 Not Estab. ug/mL   Microalb/Creat Ratio <5 0 - 29 mg/g creat  Vitamin B12  Result Value Ref Range   Vitamin B-12 863 232 - 1,245 pg/mL     Nail Removal  Date/Time: 03/05/2021 12:30 PM Performed by: Baruch Gouty, FNP Authorized by: Baruch Gouty, FNP   Consent:    Consent obtained:  Written   Consent given by:  Patient   Risks, benefits, and alternatives were discussed: yes     Risks discussed:  Bleeding, incomplete removal, infection, pain and permanent nail deformity   Alternatives discussed:  No treatment, delayed treatment, alternative treatment, observation and referral Universal protocol:    Procedure explained and questions answered to patient or proxy's satisfaction: yes     Relevant documents present and verified: yes     Immediately prior to procedure a time out was called: yes     Patient identity confirmed:  Verbally with patient Location:    Foot:  R big toe Pre-procedure details:    Skin preparation:  Povidone-iodine and alcohol   Preparation: Patient was prepped and draped in the usual sterile fashion   Anesthesia:    Anesthesia method:  Nerve block   Block location:  Proximal right great toe   Block needle gauge:  24 G   Block anesthetic:  Lidocaine 2% w/o epi   Block injection procedure:  Anatomic landmarks identified, introduced needle, incremental injection, anatomic landmarks palpated and negative aspiration for blood   Block outcome:  Anesthesia achieved Nail Removal:    Nail removed:  Partial   Nail side:  Medial   Nail bed repaired: no     Removed nail replaced and anchored: no   Ingrown nail:    Wedge excision of skin: yes     Nail matrix removed or ablated:  Complete Post-procedure details:    Dressing:  Xeroform gauze, post-op shoe, antibiotic ointment, 4x4 sterile gauze and gauze roll   Procedure completion:  Tolerated well, no immediate complications   Pertinent labs & imaging results that were available during my  care of the patient were reviewed by me and considered in my medical decision making.  Assessment & Plan:  Hunt was seen today for nail problem and shoulder pain.  Diagnoses and all orders for this visit:  Ingrown toenail of right foot Right great ingrown toenail removed in office today.  Patient tolerated procedure very well.  Dressing applied postprocedure and patient placed in postop boot.  Wound care discussed in detail.  Follow-up in 2 weeks for reevaluation. -     Nail Removal  Cervical radiculopathy Improved symptoms with prednisone therapy.  Awaiting Ortho appointment.  Will place on naproxen 500 mg twice daily for anti-inflammatory properties.  Patient aware to see Ortho as discussed. -     naproxen (NAPROSYN) 500 MG tablet; Take 1 tablet (500 mg total) by mouth 2 (two) times daily with a meal.    Continue all other maintenance medications.  Follow up plan: Return in about 2 weeks (around 03/19/2021), or if symptoms worsen or fail to improve, for toenail .   Continue healthy lifestyle choices, including diet (rich in fruits, vegetables, and lean proteins, and low in salt and simple carbohydrates) and exercise (at least 30 minutes of moderate physical activity daily).  Educational handout given for toenail removal  The above assessment and management plan was discussed with the patient. The patient verbalized understanding of and has agreed to the management plan. Patient is aware to call the clinic if they develop any new symptoms or if symptoms persist or worsen. Patient is aware when to return to the clinic for a follow-up visit. Patient educated on when it is appropriate to go to the emergency department.   Monia Pouch, FNP-C [facility] [phone]

## 2021-03-22 ENCOUNTER — Ambulatory Visit: Payer: BC Managed Care – PPO | Admitting: Family Medicine

## 2021-08-24 ENCOUNTER — Other Ambulatory Visit: Payer: Self-pay | Admitting: Family Medicine

## 2021-08-24 DIAGNOSIS — I10 Essential (primary) hypertension: Secondary | ICD-10-CM

## 2021-08-26 ENCOUNTER — Encounter: Payer: Self-pay | Admitting: Family Medicine

## 2021-08-26 NOTE — Telephone Encounter (Signed)
30 days given ntbs  

## 2021-08-26 NOTE — Telephone Encounter (Signed)
Nvm letter mailed ?

## 2021-08-30 ENCOUNTER — Telehealth: Payer: Self-pay | Admitting: Family Medicine

## 2021-08-30 ENCOUNTER — Ambulatory Visit: Payer: BC Managed Care – PPO | Admitting: Family Medicine

## 2021-08-30 ENCOUNTER — Encounter: Payer: Self-pay | Admitting: Family Medicine

## 2021-08-30 VITALS — BP 113/78 | HR 58 | Temp 98.1°F | Ht 74.0 in | Wt 264.0 lb

## 2021-08-30 DIAGNOSIS — E669 Obesity, unspecified: Secondary | ICD-10-CM | POA: Diagnosis not present

## 2021-08-30 DIAGNOSIS — E66811 Obesity, class 1: Secondary | ICD-10-CM

## 2021-08-30 DIAGNOSIS — I10 Essential (primary) hypertension: Secondary | ICD-10-CM | POA: Diagnosis not present

## 2021-08-30 DIAGNOSIS — Z7689 Persons encountering health services in other specified circumstances: Secondary | ICD-10-CM | POA: Diagnosis not present

## 2021-08-30 MED ORDER — SAXENDA 18 MG/3ML ~~LOC~~ SOPN: 0.6000 mg | PEN_INJECTOR | Freq: Every day | SUBCUTANEOUS | 0 refills | Status: AC

## 2021-08-30 MED ORDER — LISINOPRIL-HYDROCHLOROTHIAZIDE 20-12.5 MG PO TABS: 2.0000 | ORAL_TABLET | Freq: Every day | ORAL | 3 refills | Status: DC

## 2021-08-30 NOTE — Progress Notes (Signed)
Subjective:  Patient ID: Spencer Anderson, male    DOB: 1970/07/14, 51 y.o.   MRN: [id number]  Patient Care Team: Baruch Gouty, FNP as PCP - General (Family Medicine)   Chief Complaint:  Medical Management of Chronic Issues   HPI: Spencer Anderson is a 51 y.o. male presenting on 08/30/2021 for Medical Management of Chronic Issues   1. Essential hypertension BP has been well controlled on current regimen. He denies headaches, chest pain, leg swelling, dizziness, weakness, or confusion. No changes in urinary output. Reports blood pressure is running 120/70 range at home.   2. Obesity, Class I, BMI 30-34.9 Has been following a keto diet for a few months. States he has lost about 20 pounds but can not seem to drop more. He would like to retry medications to assist with weight loss.     Relevant past medical, surgical, family, and social history reviewed and updated as indicated.  Allergies and medications reviewed and updated. Data reviewed: Chart in Epic.   Past Medical History:  Diagnosis Date   Hypertension     History reviewed. No pertinent surgical history.  Social History   Socioeconomic History   Marital status: Divorced    Spouse name: Not on file   Number of children: Not on file   Years of education: Not on file   Highest education level: Not on file  Occupational History   Not on file  Tobacco Use   Smoking status: Former    Packs/day: 1.00    Years: 22.00    Pack years: 22.00    Types: Cigarettes    Quit date: 01/12/2008    Years since quitting: 13.6    Passive exposure: Past   Smokeless tobacco: Never  Vaping Use   Vaping Use: Never used  Substance and Sexual Activity   Alcohol use: Not Currently   Drug use: Never   Sexual activity: Not Currently  Other Topics Concern   Not on file  Social History Narrative   Not on file   Social Determinants of Health   Financial Resource Strain: Not on file  Food Insecurity: Not on file  Transportation  Needs: Not on file  Physical Activity: Not on file  Stress: Not on file  Social Connections: Not on file  Intimate Partner Violence: Not on file    Outpatient Encounter Medications as of 08/30/2021  Medication Sig   cyanocobalamin 1000 MCG tablet Take by mouth.   [DISCONTINUED] lisinopril-hydrochlorothiazide (ZESTORETIC) 20-12.5 MG tablet Take 2 tablets by mouth daily. Needs office visit for further refills   lisinopril-hydrochlorothiazide (ZESTORETIC) 20-12.5 MG tablet Take 2 tablets by mouth daily. Needs office visit for further refills   [DISCONTINUED] mupirocin ointment (BACTROBAN) 2 % Apply topically 2 (two) times daily.   [DISCONTINUED] naproxen (NAPROSYN) 500 MG tablet Take 1 tablet (500 mg total) by mouth 2 (two) times daily with a meal.   [DISCONTINUED] predniSONE (STERAPRED UNI-PAK 21 TAB) 10 MG (21) TBPK tablet As directed x 6 days   No facility-administered encounter medications on file as of 08/30/2021.    No Known Allergies  Review of Systems  Constitutional:  Negative for activity change, appetite change, chills, diaphoresis, fatigue, fever and unexpected weight change.  Eyes:  Negative for photophobia and visual disturbance.  Respiratory:  Negative for cough and shortness of breath.   Cardiovascular:  Negative for chest pain and leg swelling.  Gastrointestinal:  Negative for abdominal pain.  Genitourinary:  Negative for decreased urine volume and difficulty  urinating.  Neurological:  Negative for dizziness, weakness and headaches.  Psychiatric/Behavioral:  Negative for confusion.   All other systems reviewed and are negative.      Objective:  BP 113/78   Pulse (!) 58   Temp 98.1 F (36.7 C)   Ht '6\' 2"'  (1.88 m)   Wt 264 lb (119.7 kg)   SpO2 97%   BMI 33.90 kg/m    Wt Readings from Last 3 Encounters:  08/30/21 264 lb (119.7 kg)  03/05/21 263 lb (119.3 kg)  02/14/21 263 lb (119.3 kg)    Physical Exam Vitals and nursing note reviewed.  Constitutional:       General: He is not in acute distress.    Appearance: Normal appearance. He is obese. He is not ill-appearing, toxic-appearing or diaphoretic.  HENT:     Head: Normocephalic and atraumatic.  Eyes:     Pupils: Pupils are equal, round, and reactive to light.  Cardiovascular:     Rate and Rhythm: Normal rate and regular rhythm.     Heart sounds: Normal heart sounds. No murmur heard.   No friction rub. No gallop.  Pulmonary:     Effort: Pulmonary effort is normal.     Breath sounds: Normal breath sounds.  Musculoskeletal:     Right lower leg: No edema.     Left lower leg: No edema.  Skin:    General: Skin is warm and dry.     Capillary Refill: Capillary refill takes less than 2 seconds.  Neurological:     General: No focal deficit present.     Mental Status: He is alert and oriented to person, place, and time.  Psychiatric:        Mood and Affect: Mood normal.        Behavior: Behavior normal.        Thought Content: Thought content normal.        Judgment: Judgment normal.    Results for orders placed or performed in visit on 02/01/21  CBC with Differential/Platelet  Result Value Ref Range   WBC 7.4 3.4 - 10.8 x10E3/uL   RBC 4.66 4.14 - 5.80 x10E6/uL   Hemoglobin 14.2 13.0 - 17.7 g/dL   Hematocrit 40.2 37.5 - 51.0 %   MCV 86 79 - 97 fL   MCH 30.5 26.6 - 33.0 pg   MCHC 35.3 31.5 - 35.7 g/dL   RDW 12.5 11.6 - 15.4 %   Platelets 285 150 - 450 x10E3/uL   Neutrophils 57 Not Estab. %   Lymphs 29 Not Estab. %   Monocytes 9 Not Estab. %   Eos 3 Not Estab. %   Basos 1 Not Estab. %   Neutrophils Absolute 4.2 1.4 - 7.0 x10E3/uL   Lymphocytes Absolute 2.2 0.7 - 3.1 x10E3/uL   Monocytes Absolute 0.6 0.1 - 0.9 x10E3/uL   EOS (ABSOLUTE) 0.3 0.0 - 0.4 x10E3/uL   Basophils Absolute 0.1 0.0 - 0.2 x10E3/uL   Immature Granulocytes 1 Not Estab. %   Immature Grans (Abs) 0.1 0.0 - 0.1 x10E3/uL  CMP14+EGFR  Result Value Ref Range   Glucose 88 70 - 99 mg/dL   BUN 14 6 - 24 mg/dL    Creatinine, Ser 0.97 0.76 - 1.27 mg/dL   eGFR 96 >59 mL/min/1.73   BUN/Creatinine Ratio 14 9 - 20   Sodium 141 134 - 144 mmol/L   Potassium 4.0 3.5 - 5.2 mmol/L   Chloride 99 96 - 106 mmol/L   CO2 25 20 -  29 mmol/L   Calcium 9.5 8.7 - 10.2 mg/dL   Total Protein 7.2 6.0 - 8.5 g/dL   Albumin 4.9 4.0 - 5.0 g/dL   Globulin, Total 2.3 1.5 - 4.5 g/dL   Albumin/Globulin Ratio 2.1 1.2 - 2.2   Bilirubin Total 0.3 0.0 - 1.2 mg/dL   Alkaline Phosphatase 89 44 - 121 IU/L   AST 26 0 - 40 IU/L   ALT 40 0 - 44 IU/L  Lipid panel  Result Value Ref Range   Cholesterol, Total 188 100 - 199 mg/dL   Triglycerides 123 0 - 149 mg/dL   HDL 38 (L) >39 mg/dL   VLDL Cholesterol Cal 22 5 - 40 mg/dL   LDL Chol Calc (NIH) 128 (H) 0 - 99 mg/dL   Chol/HDL Ratio 4.9 0.0 - 5.0 ratio  Thyroid Panel With TSH  Result Value Ref Range   TSH 1.590 0.450 - 4.500 uIU/mL   T4, Total 7.4 4.5 - 12.0 ug/dL   T3 Uptake Ratio 25 24 - 39 %   Free Thyroxine Index 1.9 1.2 - 4.9  Microalbumin / creatinine urine ratio  Result Value Ref Range   Creatinine, Urine 62.1 Not Estab. mg/dL   Microalbumin, Urine <3.0 Not Estab. ug/mL   Microalb/Creat Ratio <5 0 - 29 mg/g creat  Vitamin B12  Result Value Ref Range   Vitamin B-12 863 232 - 1,245 pg/mL       Pertinent labs & imaging results that were available during my care of the patient were reviewed by me and considered in my medical decision making.  Assessment & Plan:  Sinclair was seen today for medical management of chronic issues.  Diagnoses and all orders for this visit:  Essential hypertension Well controlled on current regimen, continue. Will recheck BMP today. DASH diet and exercise encouraged.  -     lisinopril-hydrochlorothiazide (ZESTORETIC) 20-12.5 MG tablet; Take 2 tablets by mouth daily. Needs office visit for further refills -     BMP8+EGFR  Obesity, Class I, BMI 30-34.9 Diet and exercise encouraged. Pt to see if insurance will cover [facility] or [facility] and  let provider know. Will order medication if covered by pharmacy.  -     BMP8+EGFR     Continue all other maintenance medications.  Follow up plan: Return in about 1 year (around 08/31/2022), or if symptoms worsen or fail to improve, for HTN.   Continue healthy lifestyle choices, including diet (rich in fruits, vegetables, and lean proteins, and low in salt and simple carbohydrates) and exercise (at least 30 minutes of moderate physical activity daily).  Educational handout given for DASH diet  The above assessment and management plan was discussed with the patient. The patient verbalized understanding of and has agreed to the management plan. Patient is aware to call the clinic if they develop any new symptoms or if symptoms persist or worsen. Patient is aware when to return to the clinic for a follow-up visit. Patient educated on when it is appropriate to go to the emergency department.   Monia Pouch, FNP-C [facility] [phone]

## 2021-08-30 NOTE — Telephone Encounter (Signed)
Pt had an apt today. Pt says that his insurance will cover [facility]. [facility] will need a PA. Pt says that he is willing try [facility] first. [facility] [place].

## 2021-08-30 NOTE — Addendum Note (Signed)
Addended by: Sonny Masters on: 08/30/2021 02:58 PM   Modules accepted: Orders

## 2021-08-30 NOTE — Telephone Encounter (Signed)
Attempted to contact patient and VM full

## 2021-08-30 NOTE — Telephone Encounter (Signed)
Patient aware.

## 2021-08-30 NOTE — Patient Instructions (Addendum)
Here is a guide to help [place] find out which weight loss medications will be covered by your insurance plan.  Please check out this web site  [email or web address] and follow the 3 simple steps.   Ask about Reginal Lutes or Saxenda  There is also a phone number you can call if you do not have access to the Internet. [phone] (Monday- Friday 8am-8pm)  [employer] provides coverage information for more than 80% of the inquiries submitted!!   Goal BP:  For patients younger than 60: Goal BP < 140/90. For patients 60 and older: Goal BP < 150/90. For patients with diabetes: Goal BP < 140/90.  Take your medications faithfully as prescribed. Maintain a healthy weight. Get at least 150 minutes of aerobic exercise per week. Minimize salt intake, less than 2000 mg per day. Minimize alcohol intake.  DASH Eating Plan DASH stands for "Dietary Approaches to Stop Hypertension." The DASH eating plan is a healthy eating plan that has been shown to reduce high blood pressure (hypertension). Additional health benefits may include reducing the risk of type 2 diabetes mellitus, heart disease, and stroke. The DASH eating plan may also help with weight loss.  WHAT DO I NEED TO KNOW ABOUT THE DASH EATING PLAN? For the DASH eating plan, you will follow these general guidelines: Choose foods with a percent daily value for sodium of less than 5% (as listed on the food label). Use salt-free seasonings or herbs instead of table salt or sea salt. Check with your health care provider or pharmacist before using salt substitutes. Eat lower-sodium products, often labeled as "lower sodium" or "no salt added." Eat fresh foods. Eat more vegetables, fruits, and low-fat dairy products. Choose whole grains. Look for the word "whole" as the first word in the ingredient list. Choose fish and skinless chicken or [place] more often than red meat. Limit fish, poultry, and meat to 6 oz (170 g) each day. Limit sweets, desserts, sugars, and  sugary drinks. Choose heart-healthy fats. Limit cheese to 1 oz (28 g) per day. Eat more home-cooked food and less restaurant, buffet, and fast food. Limit fried foods. Cook foods using methods other than frying. Limit canned vegetables. If you do use them, rinse them well to decrease the sodium. When eating at a restaurant, ask that your food be prepared with less salt, or no salt if possible.  WHAT FOODS CAN I EAT? Seek help from a dietitian for individual calorie needs.  Grains Whole grain or whole wheat bread. Brown rice. Whole grain or whole wheat pasta. Quinoa, bulgur, and whole grain cereals. Low-sodium cereals. Corn or whole wheat flour tortillas. Whole grain cornbread. Whole grain crackers. Low-sodium crackers.  Vegetables Fresh or frozen vegetables (raw, steamed, roasted, or grilled). Low-sodium or reduced-sodium tomato and vegetable juices. Low-sodium or reduced-sodium tomato sauce and paste. Low-sodium or reduced-sodium canned vegetables.   Fruits All fresh, canned (in natural juice), or frozen fruits.  Meat and Other Protein Products Ground beef (85% or leaner), grass-fed beef, or beef trimmed of fat. Skinless chicken or [place]. Ground chicken or [place]. Pork trimmed of fat. All fish and seafood. Eggs. Dried beans, peas, or lentils. Unsalted nuts and seeds. Unsalted canned beans.  Dairy Low-fat dairy products, such as skim or 1% milk, 2% or reduced-fat cheeses, low-fat ricotta or cottage cheese, or plain low-fat yogurt. Low-sodium or reduced-sodium cheeses.  Fats and Oils Tub margarines without trans fats. Light or reduced-fat mayonnaise and salad dressings (reduced sodium). Avocado. Safflower, olive, or canola oils. Natural  peanut or almond butter.  Other Unsalted popcorn and pretzels. The items listed above may not be a complete list of recommended foods or beverages. Contact your dietitian for more options.  WHAT FOODS ARE NOT RECOMMENDED?  Grains White bread.  White pasta. White rice. Refined cornbread. Bagels and croissants. Crackers that contain trans fat.  Vegetables Creamed or fried vegetables. Vegetables in a cheese sauce. Regular canned vegetables. Regular canned tomato sauce and paste. Regular tomato and vegetable juices.  Fruits Dried fruits. Canned fruit in light or heavy syrup. Fruit juice.  Meat and Other Protein Products Fatty cuts of meat. Ribs, chicken wings, bacon, sausage, bologna, salami, chitterlings, fatback, hot dogs, bratwurst, and packaged luncheon meats. Salted nuts and seeds. Canned beans with salt.  Dairy Whole or 2% milk, cream, half-and-half, and cream cheese. Whole-fat or sweetened yogurt. Full-fat cheeses or blue cheese. Nondairy creamers and whipped toppings. Processed cheese, cheese spreads, or cheese curds.  Condiments Onion and garlic salt, seasoned salt, table salt, and sea salt. Canned and packaged gravies. Worcestershire sauce. Tartar sauce. Barbecue sauce. Teriyaki sauce. Soy sauce, including reduced sodium. Steak sauce. Fish sauce. Oyster sauce. Cocktail sauce. Horseradish. Ketchup and mustard. Meat flavorings and tenderizers. Bouillon cubes. Hot sauce. Tabasco sauce. Marinades. Taco seasonings. Relishes.  Fats and Oils Butter, stick margarine, lard, shortening, ghee, and bacon fat. Coconut, palm kernel, or palm oils. Regular salad dressings.  Other Pickles and olives. Salted popcorn and pretzels.  The items listed above may not be a complete list of foods and beverages to avoid. Contact your dietitian for more information.  WHERE CAN I FIND MORE INFORMATION? National [employer], [employer], and [employer] Institute: [employer] Document Released: 03/20/2011 Document Revised: 08/15/2013 Document Reviewed: 02/02/2013 [facility] Patient Information 2015 [place], [place]. This information is not intended to replace advice given to you by your health care provider. Make sure you discuss any  questions you have with your health care provider.   I think that you would greatly benefit from seeing a nutritionist.  If you are interested, please call Dr. Gerilyn Pilgrim at [phone] to schedule an appointment.

## 2021-08-31 LAB — BMP8+EGFR
BUN/Creatinine Ratio: 15 (ref 9–20)
BUN: 15 mg/dL (ref 6–24)
CO2: 26 mmol/L (ref 20–29)
Calcium: 9.7 mg/dL (ref 8.7–10.2)
Chloride: 102 mmol/L (ref 96–106)
Creatinine, Ser: 1 mg/dL (ref 0.76–1.27)
Glucose: 88 mg/dL (ref 70–99)
Potassium: 4.6 mmol/L (ref 3.5–5.2)
Sodium: 144 mmol/L (ref 134–144)
eGFR: 93 mL/min/{1.73_m2} (ref >59)

## 2021-09-04 ENCOUNTER — Other Ambulatory Visit: Payer: Self-pay | Admitting: *Deleted

## 2021-09-04 MED ORDER — INSULIN PEN NEEDLE 32G X 4 MM MISC: 1 refills | Status: DC

## 2021-09-20 ENCOUNTER — Encounter: Payer: Self-pay | Admitting: Family Medicine

## 2021-09-20 ENCOUNTER — Ambulatory Visit: Payer: BC Managed Care – PPO | Admitting: Family Medicine

## 2021-09-20 VITALS — BP 105/69 | HR 71 | Temp 98.6°F | Ht 74.0 in | Wt 257.0 lb

## 2021-09-20 DIAGNOSIS — I10 Essential (primary) hypertension: Secondary | ICD-10-CM | POA: Diagnosis not present

## 2021-09-20 DIAGNOSIS — E8881 Metabolic syndrome: Secondary | ICD-10-CM | POA: Diagnosis not present

## 2021-09-20 DIAGNOSIS — E669 Obesity, unspecified: Secondary | ICD-10-CM

## 2021-09-20 DIAGNOSIS — Z7689 Persons encountering health services in other specified circumstances: Secondary | ICD-10-CM

## 2021-09-20 MED ORDER — SAXENDA 18 MG/3ML ~~LOC~~ SOPN: 3.0000 mg | PEN_INJECTOR | Freq: Every day | SUBCUTANEOUS | 3 refills | Status: AC

## 2021-09-20 NOTE — Progress Notes (Signed)
Subjective:  Patient ID: Spencer Anderson, male    DOB: [birth year], 51 y.o.   MRN: [id number]  Patient Care Team: Baruch Gouty, FNP as PCP - General (Family Medicine)   Chief Complaint:  Obesity and Weight Check   HPI: Spencer Anderson is a 51 y.o. male presenting on 09/20/2021 for Obesity and Weight Check   Pt presents today for weight management. He was started on Saxenda and has done well. He is currently on 1.8 mg dosing and tolerating well. He did have a few days of nausea but this has subsided. States his appetite is much better controlled, no binge eating. He did have one episode of constipation which was relieved with stool softeners. He reports dietary changes and regular exercise. He is about 10 pounds lighter than last visit. BP is better controlled. No headaches, chest pain, leg swelling, weakness, or confusion.     Relevant past medical, surgical, family, and social history reviewed and updated as indicated.  Allergies and medications reviewed and updated. Data reviewed: Chart in Epic.   Past Medical History:  Diagnosis Date   Hypertension     History reviewed. No pertinent surgical history.  Social History   Socioeconomic History   Marital status: Divorced    Spouse name: Not on file   Number of children: Not on file   Years of education: Not on file   Highest education level: Not on file  Occupational History   Not on file  Tobacco Use   Smoking status: Former    Packs/day: 1.00    Years: 22.00    Total pack years: 22.00    Types: Cigarettes    Quit date: 01/12/2008    Years since quitting: 13.6    Passive exposure: Past   Smokeless tobacco: Never  Vaping Use   Vaping Use: Never used  Substance and Sexual Activity   Alcohol use: Not Currently   Drug use: Never   Sexual activity: Not Currently  Other Topics Concern   Not on file  Social History Narrative   Not on file   Social Determinants of Health   Financial Resource Strain: Not on file   Food Insecurity: Not on file  Transportation Needs: Not on file  Physical Activity: Not on file  Stress: Not on file  Social Connections: Not on file  Intimate Partner Violence: Not on file    Outpatient Encounter Medications as of 09/20/2021  Medication Sig   cyanocobalamin 1000 MCG tablet Take by mouth.   Insulin Pen Needle 32G X 4 MM MISC Use with Saxenda daily E66.9   lisinopril-hydrochlorothiazide (ZESTORETIC) 20-12.5 MG tablet Take 2 tablets by mouth daily. Needs office visit for further refills   Liraglutide -Weight Management (SAXENDA) 18 MG/3ML SOPN Inject 3 mg into the skin daily.   No facility-administered encounter medications on file as of 09/20/2021.    No Known Allergies  Review of Systems  Constitutional:  Positive for appetite change. Negative for activity change, chills, diaphoresis, fatigue, fever and unexpected weight change.  HENT: Negative.    Eyes: Negative.  Negative for photophobia and visual disturbance.  Respiratory:  Negative for cough, chest tightness and shortness of breath.   Cardiovascular:  Negative for chest pain, palpitations and leg swelling.  Gastrointestinal:  Negative for abdominal pain, blood in stool, constipation, diarrhea, nausea and vomiting.  Endocrine: Negative.   Genitourinary:  Negative for decreased urine volume, difficulty urinating, dysuria, frequency and urgency.  Musculoskeletal:  Negative for arthralgias and myalgias.  Skin: Negative.   Allergic/Immunologic: Negative.   Neurological:  Negative for dizziness, tremors, seizures, syncope, facial asymmetry, speech difficulty, weakness, light-headedness, numbness and headaches.  Hematological: Negative.   Psychiatric/Behavioral:  Negative for confusion, hallucinations, sleep disturbance and suicidal ideas.   All other systems reviewed and are negative.       Objective:  BP 105/69   Pulse 71   Temp 98.6 F (37 C)   Ht $R'6\' 2"'lr$  (1.88 m)   Wt 257 lb (116.6 kg)   SpO2 97%   BMI  33.00 kg/m    Wt Readings from Last 3 Encounters:  09/20/21 257 lb (116.6 kg)  08/30/21 264 lb (119.7 kg)  03/05/21 263 lb (119.3 kg)    Physical Exam Vitals and nursing note reviewed.  Constitutional:      General: He is not in acute distress.    Appearance: Normal appearance. He is well-developed and well-groomed. He is obese. He is not ill-appearing, toxic-appearing or diaphoretic.  HENT:     Head: Normocephalic and atraumatic.     Jaw: There is normal jaw occlusion.     Right Ear: Hearing normal.     Left Ear: Hearing normal.     Mouth/Throat:     Lips: Pink.     Mouth: Mucous membranes are moist.     Pharynx: Uvula midline.  Eyes:     General: Lids are normal.     Extraocular Movements: Extraocular movements intact.     Conjunctiva/sclera: Conjunctivae normal.     Pupils: Pupils are equal, round, and reactive to light.  Neck:     Thyroid: No thyroid mass, thyromegaly or thyroid tenderness.     Vascular: No JVD.     Trachea: Trachea and phonation normal.  Cardiovascular:     Rate and Rhythm: Normal rate and regular rhythm.     Chest Wall: PMI is not displaced.     Pulses: Normal pulses.     Heart sounds: Normal heart sounds. No murmur heard.    No friction rub. No gallop.  Pulmonary:     Effort: Pulmonary effort is normal.     Breath sounds: Normal breath sounds. No wheezing.  Abdominal:     General: There is no abdominal bruit.     Palpations: There is no hepatomegaly or splenomegaly.  Musculoskeletal:     Cervical back: Neck supple.     Right lower leg: No edema.     Left lower leg: No edema.  Skin:    General: Skin is warm and dry.     Capillary Refill: Capillary refill takes less than 2 seconds.     Coloration: Skin is not cyanotic, jaundiced or pale.     Findings: No rash.  Neurological:     General: No focal deficit present.     Mental Status: He is alert and oriented to person, place, and time.     Sensory: Sensation is intact.     Motor: Motor  function is intact.     Coordination: Coordination is intact.     Gait: Gait is intact.     Deep Tendon Reflexes: Reflexes are normal and symmetric.  Psychiatric:        Attention and Perception: Attention and perception normal.        Mood and Affect: Mood and affect normal.        Speech: Speech normal.        Behavior: Behavior normal. Behavior is cooperative.        Thought  Content: Thought content normal.        Cognition and Memory: Cognition and memory normal.        Judgment: Judgment normal.     Results for orders placed or performed in visit on 08/30/21  BMP8+EGFR  Result Value Ref Range   Glucose 88 70 - 99 mg/dL   BUN 15 6 - 24 mg/dL   Creatinine, Ser 1.00 0.76 - 1.27 mg/dL   eGFR 93 >59 mL/min/1.73   BUN/Creatinine Ratio 15 9 - 20   Sodium 144 134 - 144 mmol/L   Potassium 4.6 3.5 - 5.2 mmol/L   Chloride 102 96 - 106 mmol/L   CO2 26 20 - 29 mmol/L   Calcium 9.7 8.7 - 10.2 mg/dL       Pertinent labs & imaging results that were available during my care of the patient were reviewed by me and considered in my medical decision making.  Assessment & Plan:  Diagnoses and all orders for this visit:  Obesity, Class I, BMI 27-67.0 Metabolic syndrome Encounter for weight management Doing well on Saxenda  with minimal side effects. Diet and exercise encouraged. Continue medications as prescribed.   Essential hypertension BP well controlled. Changes were not made in regimen today. Goal BP is 130/80. Pt aware to report any persistent high or low readings. DASH diet and exercise encouraged. Exercise at least 150 minutes per week and increase as tolerated. Goal BMI > 25. Stress management encouraged. Avoid nicotine and tobacco product use. Avoid excessive alcohol and NSAID's. Avoid more than 2000 mg of sodium daily. Medications as prescribed. Follow up as scheduled.    Continue all other maintenance medications.  Follow up plan: Return in about 3 months (around 12/21/2021),  or if symptoms worsen or fail to improve, for BMI.   Continue healthy lifestyle choices, including diet (rich in fruits, vegetables, and lean proteins, and low in salt and simple carbohydrates) and exercise (at least 30 minutes of moderate physical activity daily).  Educational handout given for weight loss, saxenda  The above assessment and management plan was discussed with the patient. The patient verbalized understanding of and has agreed to the management plan. Patient is aware to call the clinic if they develop any new symptoms or if symptoms persist or worsen. Patient is aware when to return to the clinic for a follow-up visit. Patient educated on when it is appropriate to go to the emergency department.   Monia Pouch, FNP-C [facility] [phone]

## 2021-09-20 NOTE — Patient Instructions (Signed)
1.8 mg for one week then 2.4 mg for one week the  3.0 mg for maintenance

## 2021-12-27 ENCOUNTER — Ambulatory Visit: Payer: BC Managed Care – PPO | Admitting: Family Medicine

## 2022-04-18 DIAGNOSIS — M792 Neuralgia and neuritis, unspecified: Secondary | ICD-10-CM | POA: Diagnosis not present

## 2022-04-18 DIAGNOSIS — M21621 Bunionette of right foot: Secondary | ICD-10-CM | POA: Diagnosis not present

## 2022-04-18 DIAGNOSIS — M7671 Peroneal tendinitis, right leg: Secondary | ICD-10-CM | POA: Diagnosis not present

## 2022-05-02 DIAGNOSIS — M7671 Peroneal tendinitis, right leg: Secondary | ICD-10-CM | POA: Diagnosis not present

## 2022-07-21 ENCOUNTER — Encounter: Payer: Self-pay | Admitting: *Deleted

## 2022-09-05 ENCOUNTER — Encounter: Payer: BC Managed Care – PPO | Admitting: Family Medicine

## 2022-09-11 ENCOUNTER — Encounter: Payer: BC Managed Care – PPO | Admitting: Family Medicine

## 2022-09-24 ENCOUNTER — Other Ambulatory Visit: Payer: Self-pay | Admitting: Family Medicine

## 2022-09-24 DIAGNOSIS — I10 Essential (primary) hypertension: Secondary | ICD-10-CM

## 2022-11-04 ENCOUNTER — Encounter: Payer: Self-pay | Admitting: *Deleted

## 2022-12-05 ENCOUNTER — Encounter: Payer: BC Managed Care – PPO | Admitting: Family Medicine

## 2022-12-12 ENCOUNTER — Encounter: Payer: Self-pay | Admitting: Family Medicine

## 2022-12-12 ENCOUNTER — Ambulatory Visit (INDEPENDENT_AMBULATORY_CARE_PROVIDER_SITE_OTHER): Payer: BC Managed Care – PPO | Admitting: Family Medicine

## 2022-12-12 VITALS — BP 121/80 | HR 65 | Temp 97.0°F | Ht 74.0 in | Wt 285.0 lb

## 2022-12-12 DIAGNOSIS — E782 Mixed hyperlipidemia: Secondary | ICD-10-CM | POA: Diagnosis not present

## 2022-12-12 DIAGNOSIS — Z6836 Body mass index (BMI) 36.0-36.9, adult: Secondary | ICD-10-CM | POA: Diagnosis not present

## 2022-12-12 DIAGNOSIS — E559 Vitamin D deficiency, unspecified: Secondary | ICD-10-CM

## 2022-12-12 DIAGNOSIS — Z0001 Encounter for general adult medical examination with abnormal findings: Secondary | ICD-10-CM | POA: Diagnosis not present

## 2022-12-12 DIAGNOSIS — Z6837 Body mass index (BMI) 37.0-37.9, adult: Secondary | ICD-10-CM | POA: Insufficient documentation

## 2022-12-12 DIAGNOSIS — E538 Deficiency of other specified B group vitamins: Secondary | ICD-10-CM

## 2022-12-12 DIAGNOSIS — I10 Essential (primary) hypertension: Secondary | ICD-10-CM

## 2022-12-12 DIAGNOSIS — Z0000 Encounter for general adult medical examination without abnormal findings: Secondary | ICD-10-CM

## 2022-12-12 MED ORDER — LISINOPRIL-HYDROCHLOROTHIAZIDE 20-12.5 MG PO TABS: 2.0000 | ORAL_TABLET | Freq: Every day | ORAL | 1 refills | Status: DC

## 2022-12-12 NOTE — Patient Instructions (Signed)
Here is a guide to help us find out which weight loss medications will be covered by your insurance plan.  Please check out this web site  [email or web address] and follow the instructions.   There is also a phone number you can call if you do not have access to the Internet. Call [phone] (Monday- Friday 8am-8pm) and an associate will help navigate you.   [employer] provides coverage information for more than 80% of the inquiries submitted!!  

## 2022-12-12 NOTE — Progress Notes (Signed)
Complete physical exam  Patient: Spencer Anderson   DOB: Sep 11, 1970   52 y.o. Male  MRN: [id number]  Subjective:    Chief Complaint  Patient presents with   Annual Exam    Kentrail Boll is a 52 y.o. male who presents today for a complete physical exam. He reports consuming a general diet.  The pt has started working out on a regular basis.  He generally feels well. He reports sleeping well. He does not have additional problems to discuss today.    Most recent fall risk assessment:    12/12/2022    2:10 PM  Fall Risk   Falls in the past year? 0     Most recent depression screenings:    12/12/2022    2:10 PM 09/20/2021    4:17 PM  PHQ 2/9 Scores  PHQ - 2 Score 0 0  PHQ- 9 Score 0 0    Vision:Within last year and Dental: No current dental problems and Receives regular dental care  Patient Active Problem List   Diagnosis Date Noted   BMI 36.0-36.9,adult 12/12/2022   Metabolic syndrome 02/01/2021   PVC's (premature ventricular contractions) 01/02/2020   Mixed hyperlipidemia 08/14/2017   B12 deficiency 01/17/2016   Obesity, Class I, BMI 30-34.9 01/17/2016   Essential hypertension 11/13/2014   Depression, recurrent (HCC) 11/13/2014   Past Medical History:  Diagnosis Date   Hypertension    History reviewed. No pertinent surgical history. Social History   Tobacco Use   Smoking status: Former    Current packs/day: 0.00    Average packs/day: 1 pack/day for 22.0 years (22.0 ttl pk-yrs)    Types: Cigarettes    Start date: 01/11/1986    Quit date: 01/12/2008    Years since quitting: 14.9    Passive exposure: Past   Smokeless tobacco: Never  Vaping Use   Vaping status: Never Used  Substance Use Topics   Alcohol use: Not Currently   Drug use: Never   Social History   Socioeconomic History   Marital status: Divorced    Spouse name: Not on file   Number of children: Not on file   Years of education: Not on file   Highest education level: Not on file   Occupational History   Not on file  Tobacco Use   Smoking status: Former    Current packs/day: 0.00    Average packs/day: 1 pack/day for 22.0 years (22.0 ttl pk-yrs)    Types: Cigarettes    Start date: 01/11/1986    Quit date: 01/12/2008    Years since quitting: 14.9    Passive exposure: Past   Smokeless tobacco: Never  Vaping Use   Vaping status: Never Used  Substance and Sexual Activity   Alcohol use: Not Currently   Drug use: Never   Sexual activity: Not Currently  Other Topics Concern   Not on file  Social History Narrative   Not on file   Social Determinants of Health   Financial Resource Strain: Not on file  Food Insecurity: Not on file  Transportation Needs: Not on file  Physical Activity: Not on file  Stress: Not on file  Social Connections: Not on file  Intimate Partner Violence: Not on file   Family Status  Relation Name Status   Mother  Deceased   Father  Alive   Sister  (Not Specified)  No partnership data on file   Family History  Problem Relation Age of Onset   Diabetes Mother  COPD Mother    Hypothyroidism Mother    Hypertension Father    Hyperlipidemia Father    Gout Father    Hypothyroidism Sister    Diabetes Sister    No Known Allergies    Patient Care Team: Bethanny Toelle, Doralee Albino, FNP as PCP - General (Family Medicine)   Outpatient Medications Prior to Visit  Medication Sig   cyanocobalamin 1000 MCG tablet Take by mouth.   OVER THE COUNTER MEDICATION Testosterone booster   [DISCONTINUED] lisinopril-hydrochlorothiazide (ZESTORETIC) 20-12.5 MG tablet Take 2 tablets by mouth daily. Needs office visit for further refills   [DISCONTINUED] Insulin Pen Needle 32G X 4 MM MISC Use with Saxenda daily E66.9   No facility-administered medications prior to visit.    Review of Systems  Cardiovascular:  Negative for chest pain, palpitations, orthopnea, claudication, leg swelling and PND.  Genitourinary:  Negative for dysuria, flank pain, frequency,  hematuria and urgency.  Psychiatric/Behavioral:  Positive for depression. Negative for hallucinations, memory loss, substance abuse and suicidal ideas. The patient is not nervous/anxious and does not have insomnia.   All other systems reviewed and are negative.         Objective:     BP 121/80   Pulse 65   Temp (!) 97 F (36.1 C) (Temporal)   Ht 6\' 2"  (1.88 m)   Wt 285 lb (129.3 kg)   SpO2 98%   BMI 36.59 kg/m  BP Readings from Last 3 Encounters:  12/12/22 121/80  09/20/21 105/69  08/30/21 113/78   Wt Readings from Last 3 Encounters:  12/12/22 285 lb (129.3 kg)  09/20/21 257 lb (116.6 kg)  08/30/21 264 lb (119.7 kg)   SpO2 Readings from Last 3 Encounters:  12/12/22 98%  09/20/21 97%  08/30/21 97%      Physical Exam Vitals and nursing note reviewed.  Constitutional:      General: He is not in acute distress.    Appearance: Normal appearance. He is well-developed and well-groomed. He is obese. He is not ill-appearing, toxic-appearing or diaphoretic.  HENT:     Head: Normocephalic and atraumatic.     Jaw: There is normal jaw occlusion.     Right Ear: Hearing, tympanic membrane, ear canal and external ear normal.     Left Ear: Hearing, tympanic membrane, ear canal and external ear normal.     Nose: Nose normal.     Mouth/Throat:     Lips: Pink.     Mouth: Mucous membranes are moist.     Pharynx: Oropharynx is clear. Uvula midline.  Eyes:     General: Lids are normal.     Extraocular Movements: Extraocular movements intact.     Conjunctiva/sclera: Conjunctivae normal.     Pupils: Pupils are equal, round, and reactive to light.  Neck:     Thyroid: No thyroid mass, thyromegaly or thyroid tenderness.     Vascular: No carotid bruit or JVD.     Trachea: Trachea and phonation normal.  Cardiovascular:     Rate and Rhythm: Normal rate and regular rhythm.     Chest Wall: PMI is not displaced.     Pulses: Normal pulses.     Heart sounds: Normal heart sounds. No  murmur heard.    No friction rub. No gallop.  Pulmonary:     Effort: Pulmonary effort is normal. No respiratory distress.     Breath sounds: Normal breath sounds. No wheezing.  Abdominal:     General: Bowel sounds are normal. There is no  distension or abdominal bruit.     Palpations: Abdomen is soft. There is no hepatomegaly or splenomegaly.     Tenderness: There is no abdominal tenderness. There is no right CVA tenderness or left CVA tenderness.     Hernia: No hernia is present.  Musculoskeletal:        General: Normal range of motion.     Cervical back: Normal range of motion and neck supple.     Right lower leg: No edema.     Left lower leg: No edema.  Lymphadenopathy:     Cervical: No cervical adenopathy.  Skin:    General: Skin is warm and dry.     Capillary Refill: Capillary refill takes less than 2 seconds.     Coloration: Skin is not cyanotic, jaundiced or pale.     Findings: No rash.  Neurological:     General: No focal deficit present.     Mental Status: He is alert and oriented to person, place, and time.     Sensory: Sensation is intact.     Motor: Motor function is intact.     Coordination: Coordination is intact.     Gait: Gait is intact.     Deep Tendon Reflexes: Reflexes are normal and symmetric.  Psychiatric:        Attention and Perception: Attention and perception normal.        Mood and Affect: Mood and affect normal.        Speech: Speech normal.        Behavior: Behavior normal. Behavior is cooperative.        Thought Content: Thought content normal.        Cognition and Memory: Cognition and memory normal.        Judgment: Judgment normal.      Last CBC Lab Results  Component Value Date   WBC 7.4 02/01/2021   HGB 14.2 02/01/2021   HCT 40.2 02/01/2021   MCV 86 02/01/2021   MCH 30.5 02/01/2021   RDW 12.5 02/01/2021   PLT 285 02/01/2021   Last metabolic panel Lab Results  Component Value Date   GLUCOSE 88 08/30/2021   NA 144 08/30/2021   K  4.6 08/30/2021   CL 102 08/30/2021   CO2 26 08/30/2021   BUN 15 08/30/2021   CREATININE 1.00 08/30/2021   EGFR 93 08/30/2021   CALCIUM 9.7 08/30/2021   PROT 7.2 02/01/2021   ALBUMIN 4.9 02/01/2021   LABGLOB 2.3 02/01/2021   AGRATIO 2.1 02/01/2021   BILITOT 0.3 02/01/2021   ALKPHOS 89 02/01/2021   AST 26 02/01/2021   ALT 40 02/01/2021   Last lipids Lab Results  Component Value Date   CHOL 188 02/01/2021   HDL 38 (L) 02/01/2021   LDLCALC 128 (H) 02/01/2021   TRIG 123 02/01/2021   CHOLHDL 4.9 02/01/2021    Last thyroid functions Lab Results  Component Value Date   TSH 1.590 02/01/2021   T4TOTAL 7.4 02/01/2021   Last vitamin B12 and Folate Lab Results  Component Value Date   VITAMINB12 863 02/01/2021        Assessment & Plan:    Routine Health Maintenance and Physical Exam  Immunization History  Administered Date(s) Administered   Influenza,inj,Quad PF,6+ Mos 02/14/2016, 02/02/2017, 02/26/2018, 05/30/2019, 02/01/2021   Janssen (J&J) SARS-COV-2 Vaccination 08/13/2019    Health Maintenance  Topic Date Due   DTaP/Tdap/Td (1 - Tdap) Never done   COVID-19 Vaccine (2 - 2023-24 season) 12/28/2022 (Originally 12/13/2021)  Zoster Vaccines- Shingrix (1 of 2) 03/14/2023 (Originally 11/22/2022)   INFLUENZA VACCINE  07/13/2023 (Originally 11/13/2022)   Lung Cancer Screening  12/12/2023 (Originally 11/22/2022)   Colonoscopy  12/12/2023 (Originally 11/21/2017)   Hepatitis C Screening  12/12/2023 (Originally 11/22/1990)   HIV Screening  12/12/2023 (Originally 11/22/1987)   HPV VACCINES  Aged Out    Discussed health benefits of physical activity, and encouraged him to engage in regular exercise appropriate for his age and condition.  Problem List Items Addressed This Visit       Cardiovascular and Mediastinum   Essential hypertension   Relevant Medications   lisinopril-hydrochlorothiazide (ZESTORETIC) 20-12.5 MG tablet     Other   B12 deficiency   Relevant Orders    Anemia Profile B   Mixed hyperlipidemia   Relevant Medications   lisinopril-hydrochlorothiazide (ZESTORETIC) 20-12.5 MG tablet   Other Relevant Orders   Lipid panel   BMI 36.0-36.9,adult   Relevant Orders   CMP14+EGFR   Lipid panel   Thyroid Panel With TSH   VITAMIN D 25 Hydroxy (Vit-D Deficiency, Fractures)   Anemia Profile B   Other Visit Diagnoses     Annual physical exam    -  Primary   Relevant Orders   CMP14+EGFR   Lipid panel   Thyroid Panel With TSH   VITAMIN D 25 Hydroxy (Vit-D Deficiency, Fractures)      Return in about 6 months (around 06/12/2023), or if symptoms worsen or fail to improve, for BMI.    The above assessment and management plan was discussed with the patient. The patient verbalized understanding of and has agreed to the management plan. Patient is aware to call the clinic if they develop any new symptoms or if symptoms fail to improve or worsen. Patient is aware when to return to the clinic for a follow-up visit. Patient educated on when it is appropriate to go to the emergency department.    Kari Baars, FNP-C [facility] [address] [place], [place] [address] [phone]

## 2022-12-13 LAB — ANEMIA PROFILE B
Basophils Absolute: 0.1 10*3/uL (ref 0.0–0.2)
Basos: 1 %
EOS (ABSOLUTE): 0.1 10*3/uL (ref 0.0–0.4)
Eos: 2 %
Ferritin: 252 ng/mL (ref 30–400)
Folate: 12.9 ng/mL (ref >3.0)
Hematocrit: 41.5 % (ref 37.5–51.0)
Hemoglobin: 14.1 g/dL (ref 13.0–17.7)
Immature Grans (Abs): 0 10*3/uL (ref 0.0–0.1)
Immature Granulocytes: 1 %
Iron Saturation: 30 % (ref 15–55)
Iron: 99 ug/dL (ref 38–169)
Lymphocytes Absolute: 1.8 10*3/uL (ref 0.7–3.1)
Lymphs: 32 %
MCH: 30.5 pg (ref 26.6–33.0)
MCHC: 34 g/dL (ref 31.5–35.7)
MCV: 90 fL (ref 79–97)
Monocytes Absolute: 0.5 10*3/uL (ref 0.1–0.9)
Monocytes: 9 %
Neutrophils Absolute: 3.1 10*3/uL (ref 1.4–7.0)
Neutrophils: 55 %
Platelets: 285 10*3/uL (ref 150–450)
RBC: 4.62 x10E6/uL (ref 4.14–5.80)
RDW: 12.6 % (ref 11.6–15.4)
Retic Ct Pct: 1.1 % (ref 0.6–2.6)
Total Iron Binding Capacity: 334 ug/dL (ref 250–450)
UIBC: 235 ug/dL (ref 111–343)
Vitamin B-12: 1343 pg/mL — ABNORMAL HIGH (ref 232–1245)
WBC: 5.6 10*3/uL (ref 3.4–10.8)

## 2022-12-13 LAB — CMP14+EGFR
ALT: 77 IU/L — ABNORMAL HIGH (ref 0–44)
AST: 35 IU/L (ref 0–40)
Albumin: 4.9 g/dL (ref 4.1–5.1)
Alkaline Phosphatase: 71 IU/L (ref 44–121)
BUN/Creatinine Ratio: 10 (ref 9–20)
BUN: 10 mg/dL (ref 6–24)
Bilirubin Total: 0.4 mg/dL (ref 0.0–1.2)
CO2: 27 mmol/L (ref 20–29)
Calcium: 9.9 mg/dL (ref 8.7–10.2)
Chloride: 100 mmol/L (ref 96–106)
Creatinine, Ser: 1.03 mg/dL (ref 0.76–1.27)
Globulin, Total: 2.3 g/dL (ref 1.5–4.5)
Glucose: 95 mg/dL (ref 70–99)
Potassium: 4.6 mmol/L (ref 3.5–5.2)
Sodium: 141 mmol/L (ref 134–144)
Total Protein: 7.2 g/dL (ref 6.0–8.5)
eGFR: 88 mL/min/{1.73_m2} (ref >59)

## 2022-12-13 LAB — LIPID PANEL
Chol/HDL Ratio: 5.6 ratio — ABNORMAL HIGH (ref 0.0–5.0)
Cholesterol, Total: 197 mg/dL (ref 100–199)
HDL: 35 mg/dL — ABNORMAL LOW (ref >39)
LDL Chol Calc (NIH): 131 mg/dL — ABNORMAL HIGH (ref 0–99)
Triglycerides: 174 mg/dL — ABNORMAL HIGH (ref 0–149)
VLDL Cholesterol Cal: 31 mg/dL (ref 5–40)

## 2022-12-13 LAB — THYROID PANEL WITH TSH
Free Thyroxine Index: 1.8 (ref 1.2–4.9)
T3 Uptake Ratio: 27 % (ref 24–39)
T4, Total: 6.8 ug/dL (ref 4.5–12.0)
TSH: 1.17 u[IU]/mL (ref 0.450–4.500)

## 2022-12-13 LAB — VITAMIN D 25 HYDROXY (VIT D DEFICIENCY, FRACTURES): Vit D, 25-Hydroxy: 13.9 ng/mL — ABNORMAL LOW (ref 30.0–100.0)

## 2022-12-15 MED ORDER — VITAMIN D (ERGOCALCIFEROL) 1.25 MG (50000 UNIT) PO CAPS
50000.0000 [IU] | ORAL_CAPSULE | ORAL | 3 refills | Status: DC
Start: 2022-12-15 — End: 2023-08-31

## 2022-12-15 NOTE — Addendum Note (Signed)
Addended by: Sonny Masters on: 12/15/2022 08:21 PM   Modules accepted: Orders

## 2023-02-09 ENCOUNTER — Ambulatory Visit: Payer: BC Managed Care – PPO

## 2023-02-11 ENCOUNTER — Encounter: Payer: Self-pay | Admitting: Family Medicine

## 2023-03-27 ENCOUNTER — Encounter: Payer: Self-pay | Admitting: *Deleted

## 2023-06-05 ENCOUNTER — Ambulatory Visit: Payer: BC Managed Care – PPO | Admitting: Family Medicine

## 2023-06-05 ENCOUNTER — Encounter: Payer: Self-pay | Admitting: Family Medicine

## 2023-06-05 VITALS — BP 128/88 | HR 67 | Temp 97.6°F | Ht 74.0 in | Wt 287.6 lb

## 2023-06-05 DIAGNOSIS — E538 Deficiency of other specified B group vitamins: Secondary | ICD-10-CM

## 2023-06-05 DIAGNOSIS — E782 Mixed hyperlipidemia: Secondary | ICD-10-CM | POA: Diagnosis not present

## 2023-06-05 DIAGNOSIS — E8881 Metabolic syndrome: Secondary | ICD-10-CM

## 2023-06-05 DIAGNOSIS — I10 Essential (primary) hypertension: Secondary | ICD-10-CM

## 2023-06-05 DIAGNOSIS — Z6836 Body mass index (BMI) 36.0-36.9, adult: Secondary | ICD-10-CM

## 2023-06-05 DIAGNOSIS — E559 Vitamin D deficiency, unspecified: Secondary | ICD-10-CM | POA: Insufficient documentation

## 2023-06-05 MED ORDER — TIRZEPATIDE-WEIGHT MANAGEMENT 2.5 MG/0.5ML ~~LOC~~ SOLN: 2.5000 mg | SUBCUTANEOUS | 0 refills | Status: AC

## 2023-06-05 NOTE — Progress Notes (Signed)
Subjective:  Patient ID: Spencer Anderson, male    DOB: October 06, 1970, 53 y.o.   MRN: [id number]  Patient Care Team: Sonny Masters, FNP as PCP - General (Family Medicine)   Chief Complaint:  BMI (6 month follow up )   HPI: Spencer Anderson is a 53 y.o. male presenting on 06/05/2023 for BMI (6 month follow up )   Discussed the use of AI scribe software for clinical note transcription with the patient, who gave verbal consent to proceed.  History of Present Illness   Spencer Anderson is a 53 year old male who presents for weight management and vitamin D deficiency follow-up.  He has struggled with weight management since age 18, having previously lost weight through physical labor but finding it difficult to maintain. He has tried weight loss medications such as [place] and Saxenda, experiencing side effects like constipation and nausea. Reginal Lutes was discontinued due to supply issues. His weight has increased from 285 pounds in August to 287 pounds currently. He does not engage in regular exercise but remains on his feet for 12 hours a day and drives two hours daily.  He has a history of vitamin D deficiency and recently resumed supplementation after a lapse of one to two months due to a disruption in automatic refills. No bone pain, difficulty walking, or recent fractures are reported.  His dietary habits include eating out, with meals such as country steak, potatoes, broccoli, and chicken Caesar salad. He faces challenges with portion control and calorie intake, noting a typical day might include high-calorie meals.  He experienced a recent episode of waking up with leg pain and difficulty walking, suspecting it might be due to a dream-related injury. No chest pain, headaches, or leg swelling are reported.  He is currently taking blood pressure medication without any reported issues.          Relevant past medical, surgical, family, and social history reviewed and updated as indicated.   Allergies and medications reviewed and updated. Data reviewed: Chart in Epic.   Past Medical History:  Diagnosis Date   Hypertension     History reviewed. No pertinent surgical history.  Social History   Socioeconomic History   Marital status: Divorced    Spouse name: Not on file   Number of children: Not on file   Years of education: Not on file   Highest education level: Not on file  Occupational History   Not on file  Tobacco Use   Smoking status: Former    Current packs/day: 0.00    Average packs/day: 1 pack/day for 22.0 years (22.0 ttl pk-yrs)    Types: Cigarettes    Start date: 01/11/1986    Quit date: 01/12/2008    Years since quitting: 15.4    Passive exposure: Past   Smokeless tobacco: Never  Vaping Use   Vaping status: Never Used  Substance and Sexual Activity   Alcohol use: Not Currently   Drug use: Never   Sexual activity: Not Currently  Other Topics Concern   Not on file  Social History Narrative   Not on file   Social Drivers of Health   Financial Resource Strain: Not on file  Food Insecurity: Not on file  Transportation Needs: Not on file  Physical Activity: Not on file  Stress: Not on file  Social Connections: Not on file  Intimate Partner Violence: Not on file    Outpatient Encounter Medications as of 06/05/2023  Medication Sig   cyanocobalamin 1000  MCG tablet Take by mouth.   lisinopril-hydrochlorothiazide (ZESTORETIC) 20-12.5 MG tablet Take 2 tablets by mouth daily. Needs office visit for further refills   tirzepatide (ZEPBOUND) 2.5 MG/0.5ML injection vial Inject 2.5 mg into the skin once a week for 4 doses.   Vitamin D, Ergocalciferol, (DRISDOL) 1.25 MG (50000 UNIT) CAPS capsule Take 1 capsule (50,000 Units total) by mouth every 7 (seven) days.   [DISCONTINUED] OVER THE COUNTER MEDICATION Testosterone booster   No facility-administered encounter medications on file as of 06/05/2023.    No Known Allergies  Pertinent ROS per HPI,  otherwise unremarkable      Objective:  BP 128/88   Pulse 67   Temp 97.6 F (36.4 C)   Ht 6\' 2"  (1.88 m)   Wt 287 lb 9.6 oz (130.5 kg)   SpO2 97%   BMI 36.93 kg/m    Wt Readings from Last 3 Encounters:  06/05/23 287 lb 9.6 oz (130.5 kg)  12/12/22 285 lb (129.3 kg)  09/20/21 257 lb (116.6 kg)    Physical Exam Vitals and nursing note reviewed.  Constitutional:      General: He is not in acute distress.    Appearance: Normal appearance. He is obese. He is not ill-appearing, toxic-appearing or diaphoretic.  HENT:     Head: Normocephalic and atraumatic.     Nose: Nose normal.     Mouth/Throat:     Mouth: Mucous membranes are moist.  Eyes:     Conjunctiva/sclera: Conjunctivae normal.     Pupils: Pupils are equal, round, and reactive to light.  Cardiovascular:     Rate and Rhythm: Normal rate and regular rhythm.     Heart sounds: Normal heart sounds.  Pulmonary:     Effort: Pulmonary effort is normal.     Breath sounds: Normal breath sounds.  Musculoskeletal:     Cervical back: Neck supple.     Right lower leg: No edema.     Left lower leg: No edema.  Skin:    General: Skin is warm and dry.     Capillary Refill: Capillary refill takes less than 2 seconds.  Neurological:     General: No focal deficit present.     Mental Status: He is alert and oriented to person, place, and time.  Psychiatric:        Mood and Affect: Mood normal.        Behavior: Behavior normal.        Thought Content: Thought content normal.        Judgment: Judgment normal.      Results for orders placed or performed in visit on 12/12/22  CMP14+EGFR   Collection Time: 12/12/22  2:30 PM  Result Value Ref Range   Glucose 95 70 - 99 mg/dL   BUN 10 6 - 24 mg/dL   Creatinine, Ser 1.61 0.76 - 1.27 mg/dL   eGFR 88 >09 UE/AVW/0.98   BUN/Creatinine Ratio 10 9 - 20   Sodium 141 134 - 144 mmol/L   Potassium 4.6 3.5 - 5.2 mmol/L   Chloride 100 96 - 106 mmol/L   CO2 27 20 - 29 mmol/L   Calcium  9.9 8.7 - 10.2 mg/dL   Total Protein 7.2 6.0 - 8.5 g/dL   Albumin 4.9 4.1 - 5.1 g/dL   Globulin, Total 2.3 1.5 - 4.5 g/dL   Bilirubin Total 0.4 0.0 - 1.2 mg/dL   Alkaline Phosphatase 71 44 - 121 IU/L   AST 35 0 - 40 IU/L  ALT 77 (H) 0 - 44 IU/L  Lipid panel   Collection Time: 12/12/22  2:30 PM  Result Value Ref Range   Cholesterol, Total 197 100 - 199 mg/dL   Triglycerides 782 (H) 0 - 149 mg/dL   HDL 35 (L) >95 mg/dL   VLDL Cholesterol Cal 31 5 - 40 mg/dL   LDL Chol Calc (NIH) 621 (H) 0 - 99 mg/dL   Chol/HDL Ratio 5.6 (H) 0.0 - 5.0 ratio  Thyroid Panel With TSH   Collection Time: 12/12/22  2:30 PM  Result Value Ref Range   TSH 1.170 0.450 - 4.500 uIU/mL   T4, Total 6.8 4.5 - 12.0 ug/dL   T3 Uptake Ratio 27 24 - 39 %   Free Thyroxine Index 1.8 1.2 - 4.9  VITAMIN D 25 Hydroxy (Vit-D Deficiency, Fractures)   Collection Time: 12/12/22  2:30 PM  Result Value Ref Range   Vit D, 25-Hydroxy 13.9 (L) 30.0 - 100.0 ng/mL  Anemia Profile B   Collection Time: 12/12/22  2:30 PM  Result Value Ref Range   Total Iron Binding Capacity 334 250 - 450 ug/dL   UIBC 308 657 - 846 ug/dL   Iron 99 38 - 962 ug/dL   Iron Saturation 30 15 - 55 %   Ferritin 252 30 - 400 ng/mL   Vitamin B-12 1,343 (H) 232 - 1,245 pg/mL   Folate 12.9 >3.0 ng/mL   WBC 5.6 3.4 - 10.8 x10E3/uL   RBC 4.62 4.14 - 5.80 x10E6/uL   Hemoglobin 14.1 13.0 - 17.7 g/dL   Hematocrit 95.2 84.1 - 51.0 %   MCV 90 79 - 97 fL   MCH 30.5 26.6 - 33.0 pg   MCHC 34.0 31.5 - 35.7 g/dL   RDW 32.4 40.1 - 02.7 %   Platelets 285 150 - 450 x10E3/uL   Neutrophils 55 Not Estab. %   Lymphs 32 Not Estab. %   Monocytes 9 Not Estab. %   Eos 2 Not Estab. %   Basos 1 Not Estab. %   Neutrophils Absolute 3.1 1.4 - 7.0 x10E3/uL   Lymphocytes Absolute 1.8 0.7 - 3.1 x10E3/uL   Monocytes Absolute 0.5 0.1 - 0.9 x10E3/uL   EOS (ABSOLUTE) 0.1 0.0 - 0.4 x10E3/uL   Basophils Absolute 0.1 0.0 - 0.2 x10E3/uL   Immature Granulocytes 1 Not Estab. %    Immature Grans (Abs) 0.0 0.0 - 0.1 x10E3/uL   Retic Ct Pct 1.1 0.6 - 2.6 %       Pertinent labs & imaging results that were available during my care of the patient were reviewed by me and considered in my medical decision making.  Assessment & Plan:  Stein was seen today for bmi.  Diagnoses and all orders for this visit:  Mixed hyperlipidemia -     CMP14+EGFR -     Lipid panel -     tirzepatide (ZEPBOUND) 2.5 MG/0.5ML injection vial; Inject 2.5 mg into the skin once a week for 4 doses.  Essential hypertension -     CMP14+EGFR -     CBC with Differential/Platelet -     Lipid panel -     Thyroid Panel With TSH -     tirzepatide (ZEPBOUND) 2.5 MG/0.5ML injection vial; Inject 2.5 mg into the skin once a week for 4 doses.  Metabolic syndrome -     CMP14+EGFR -     CBC with Differential/Platelet -     Lipid panel -     Thyroid Panel  With TSH -     VITAMIN D 25 Hydroxy (Vit-D Deficiency, Fractures) -     Vitamin B12 -     tirzepatide (ZEPBOUND) 2.5 MG/0.5ML injection vial; Inject 2.5 mg into the skin once a week for 4 doses.  B12 deficiency -     CBC with Differential/Platelet -     Vitamin B12  Vitamin D deficiency -     CMP14+EGFR -     VITAMIN D 25 Hydroxy (Vit-D Deficiency, Fractures)  Obesity, morbid (HCC) -     CMP14+EGFR -     CBC with Differential/Platelet -     Lipid panel -     Thyroid Panel With TSH -     VITAMIN D 25 Hydroxy (Vit-D Deficiency, Fractures) -     Vitamin B12 -     tirzepatide (ZEPBOUND) 2.5 MG/0.5ML injection vial; Inject 2.5 mg into the skin once a week for 4 doses.     Assessment and Plan    Obesity Chronic obesity with weight fluctuations. Current weight is 287 lbs, up 2 lbs since August. Previous weight loss attempts with Reginal Lutes and Saxenda were unsuccessful due to side effects and availability issues. Discussed lifestyle changes including diet and exercise. Patient's demanding work schedule limits regular exercise. Discussed potential  benefits of Mounjaro (tirzepatide) with fewer side effects. Emphasized small frequent meals, increased water, and fiber intake for success with GLP-1 medications. Patient willing to try [facility] if approved. - Submit for a one-month trial of Mounjaro (tirzepatide) for pharmacy approval - Provide paperwork on GLP-1 medications, emphasizing small frequent meals, increased water, and fiber intake - Order lab work: vitamin D levels, thyroid function, cholesterol levels  Hypertension Blood pressure well-managed on current medication. No issues such as chest pain, headache, or leg swelling reported. - Continue current blood pressure medication  Vitamin D Deficiency Patient had low vitamin D levels and missed supplements for a month or two due to refill lapse. Recently resumed supplements. No issues such as trouble walking, significant bone pain, or recent fractures reported. - Order lab work to check vitamin D levels  General Health Maintenance Discussed diet and exercise for overall health. Patient not interested in seeing a dietician due to a demanding schedule. Emphasized portion control and calorie deficit for weight loss. Provided guidance on lifestyle changes for long-term weight management. - Provide handout on diet and portion control - Encourage lifestyle changes including portion control and calorie deficit  Follow-up - Follow up after lab results are available - Follow up on Mounjaro (tirzepatide) approval.          Continue all other maintenance medications.  Follow up plan: Return in about 8 weeks (around 07/31/2023), or if symptoms worsen or fail to improve, for BMI.   Continue healthy lifestyle choices, including diet (rich in fruits, vegetables, and lean proteins, and low in salt and simple carbohydrates) and exercise (at least 30 minutes of moderate physical activity daily).  Educational handout given for Vit D deficiency, GLP1 success  The above assessment and  management plan was discussed with the patient. The patient verbalized understanding of and has agreed to the management plan. Patient is aware to call the clinic if they develop any new symptoms or if symptoms persist or worsen. Patient is aware when to return to the clinic for a follow-up visit. Patient educated on when it is appropriate to go to the emergency department.   Kari Baars, FNP-C [facility] [phone]

## 2023-06-05 NOTE — Patient Instructions (Signed)

## 2023-06-06 LAB — CBC WITH DIFFERENTIAL/PLATELET
Basophils Absolute: 0.1 10*3/uL (ref 0.0–0.2)
Basos: 1 %
EOS (ABSOLUTE): 0.3 10*3/uL (ref 0.0–0.4)
Eos: 3 %
Hematocrit: 41.2 % (ref 37.5–51.0)
Hemoglobin: 13.8 g/dL (ref 13.0–17.7)
Immature Grans (Abs): 0.1 10*3/uL (ref 0.0–0.1)
Immature Granulocytes: 1 %
Lymphocytes Absolute: 2.9 10*3/uL (ref 0.7–3.1)
Lymphs: 35 %
MCH: 30.4 pg (ref 26.6–33.0)
MCHC: 33.5 g/dL (ref 31.5–35.7)
MCV: 91 fL (ref 79–97)
Monocytes Absolute: 0.7 10*3/uL (ref 0.1–0.9)
Monocytes: 8 %
Neutrophils Absolute: 4.4 10*3/uL (ref 1.4–7.0)
Neutrophils: 52 %
Platelets: 278 10*3/uL (ref 150–450)
RBC: 4.54 x10E6/uL (ref 4.14–5.80)
RDW: 12.6 % (ref 11.6–15.4)
WBC: 8.4 10*3/uL (ref 3.4–10.8)

## 2023-06-06 LAB — CMP14+EGFR
ALT: 75 [IU]/L — ABNORMAL HIGH (ref 0–44)
AST: 32 [IU]/L (ref 0–40)
Albumin: 4.6 g/dL (ref 4.1–5.1)
Alkaline Phosphatase: 89 [IU]/L (ref 44–121)
BUN/Creatinine Ratio: 13 (ref 9–20)
BUN: 14 mg/dL (ref 6–24)
Bilirubin Total: 0.2 mg/dL (ref 0.0–1.2)
CO2: 25 mmol/L (ref 20–29)
Calcium: 9.6 mg/dL (ref 8.7–10.2)
Chloride: 101 mmol/L (ref 96–106)
Creatinine, Ser: 1.12 mg/dL (ref 0.76–1.27)
Globulin, Total: 2.5 g/dL (ref 1.5–4.5)
Glucose: 88 mg/dL (ref 70–99)
Potassium: 3.6 mmol/L (ref 3.5–5.2)
Sodium: 143 mmol/L (ref 134–144)
Total Protein: 7.1 g/dL (ref 6.0–8.5)
eGFR: 80 mL/min/{1.73_m2} (ref >59)

## 2023-06-06 LAB — VITAMIN B12: Vitamin B-12: 661 pg/mL (ref 232–1245)

## 2023-06-06 LAB — LIPID PANEL
Chol/HDL Ratio: 5 {ratio} (ref 0.0–5.0)
Cholesterol, Total: 195 mg/dL (ref 100–199)
HDL: 39 mg/dL — ABNORMAL LOW (ref >39)
LDL Chol Calc (NIH): 131 mg/dL — ABNORMAL HIGH (ref 0–99)
Triglycerides: 138 mg/dL (ref 0–149)
VLDL Cholesterol Cal: 25 mg/dL (ref 5–40)

## 2023-06-06 LAB — THYROID PANEL WITH TSH
Free Thyroxine Index: 1.7 (ref 1.2–4.9)
T3 Uptake Ratio: 25 % (ref 24–39)
T4, Total: 6.9 ug/dL (ref 4.5–12.0)
TSH: 2.54 u[IU]/mL (ref 0.450–4.500)

## 2023-06-06 LAB — VITAMIN D 25 HYDROXY (VIT D DEFICIENCY, FRACTURES): Vit D, 25-Hydroxy: 21.8 ng/mL — ABNORMAL LOW (ref 30.0–100.0)

## 2023-06-12 ENCOUNTER — Ambulatory Visit: Payer: BC Managed Care – PPO | Admitting: Family Medicine

## 2023-06-17 ENCOUNTER — Ambulatory Visit: Payer: BC Managed Care – PPO | Admitting: Family Medicine

## 2023-06-19 ENCOUNTER — Other Ambulatory Visit: Payer: Self-pay | Admitting: Family Medicine

## 2023-06-19 DIAGNOSIS — I10 Essential (primary) hypertension: Secondary | ICD-10-CM

## 2023-06-24 IMAGING — DX DG CERVICAL SPINE COMPLETE 4+V
6 series · 6 of 6 positions shown · non-contrast
Comparison: None.

CLINICAL DATA: pain and radiculopathy

EXAM:
CERVICAL SPINE - COMPLETE 4+ VIEW

[c-spine lat]
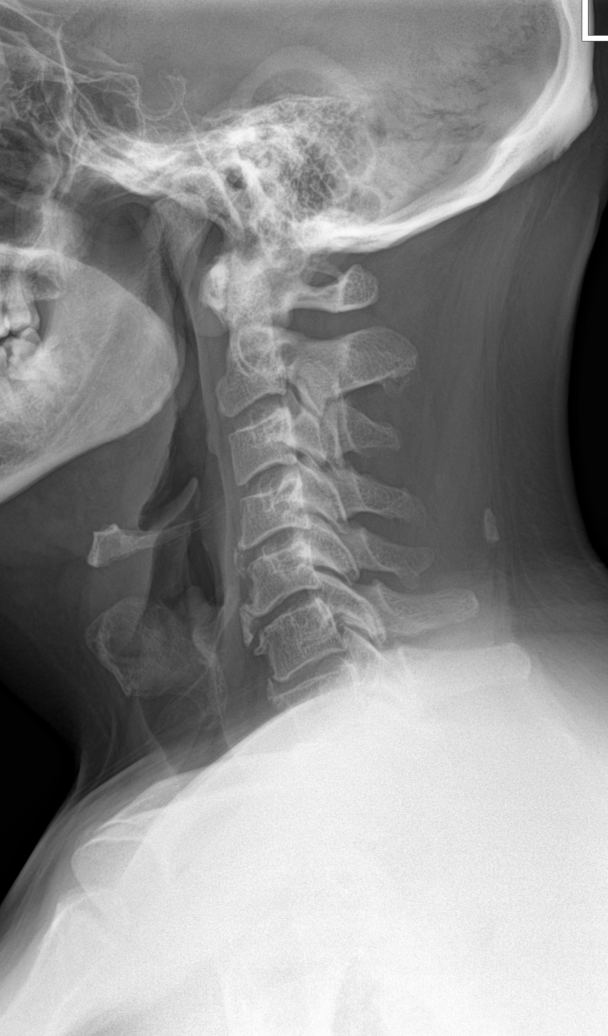

[c-spine obl (1 of 2)]
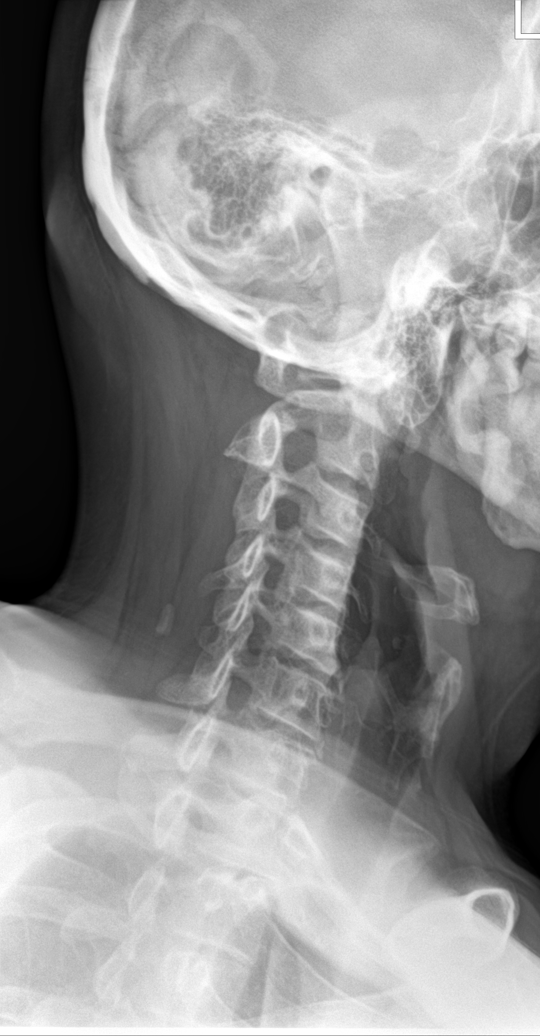

[c-spine obl (2 of 2)]
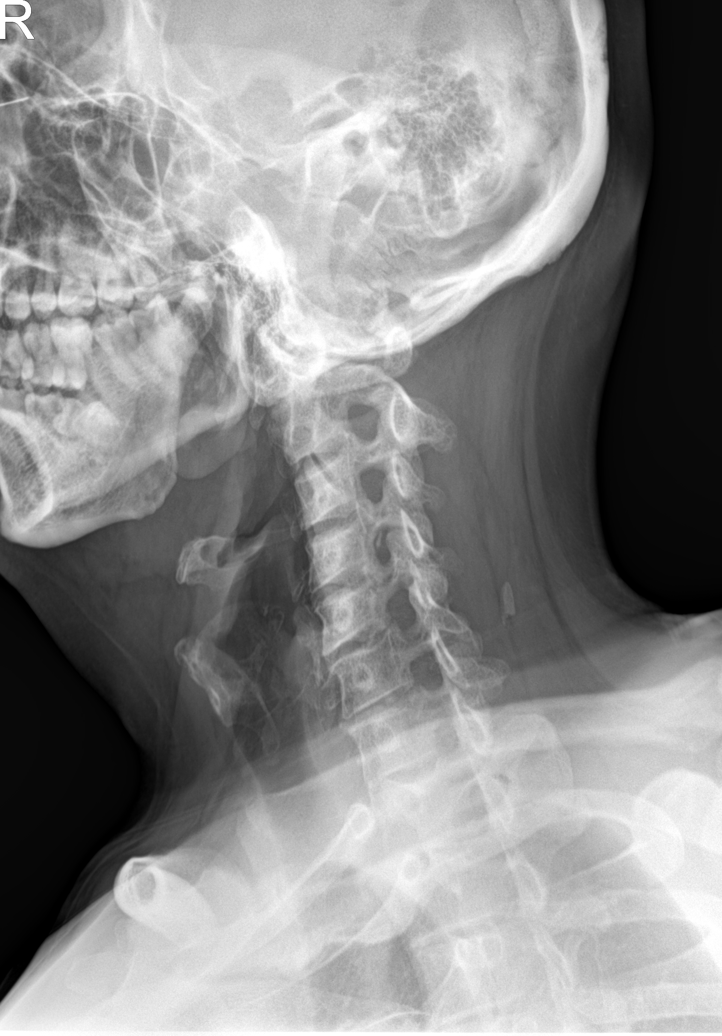

[c-spine ap]
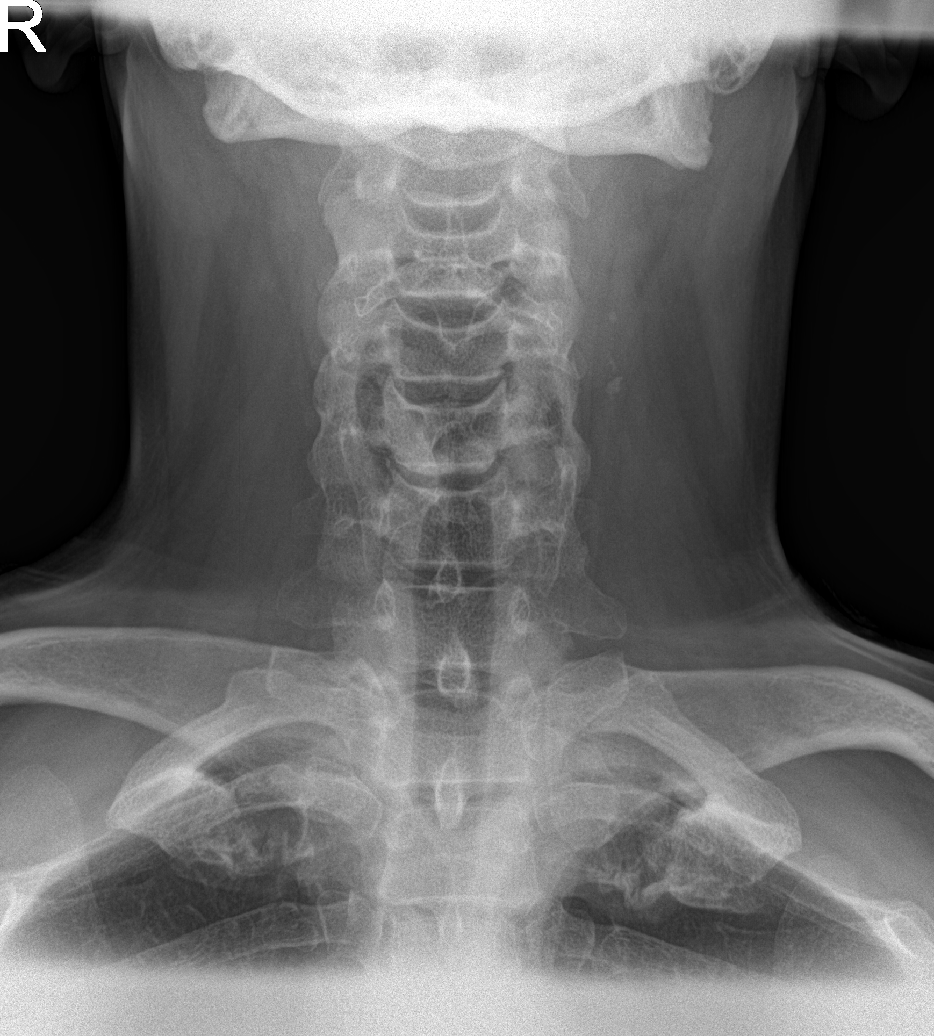

[c-spine open mouth]
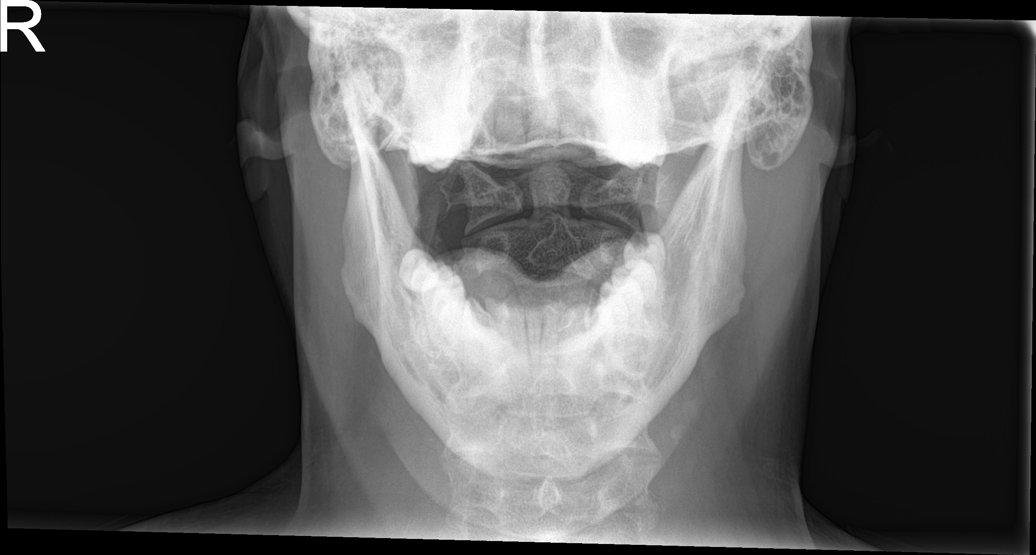

[t-spine lat swimmers]
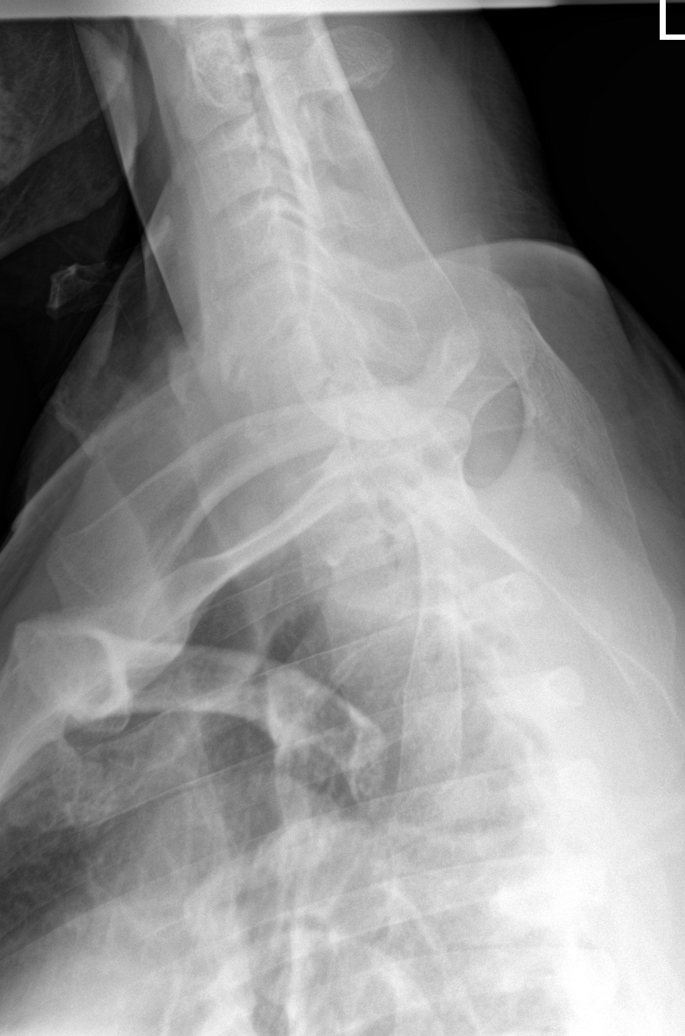

[6 of 6 positions shown; findings below may reference images not displayed]

FINDINGS: The cervical spine is visualized from C1-the superior endplate of
C7. No spondylolisthesis. Mild straightening of the cervical
lordosis. Vertebral body heights are maintained: no evidence of
acute fracture. Mild intervertebral disc space height loss at C5-6
and C6-7. Mild multilevel endplate proliferative changes. Mild
osseous neuroforaminal narrowing of bilateral C4-5 due to facet
arthropathy and uncovertebral hypertrophy. No prevertebral soft
tissue swelling. Visualized thorax is unremarkable. Faint calcific
densities project over the LEFT neck soft tissues.
IMPRESSION: 1. Mild degenerative changes of the cervical spine most pronounced
in the mid cervical spine.
2. Faint calcific densities project over the LEFT neck soft tissues
in the region of the LEFT carotid artery versus asymmetric thyroid
cartilage calcifications. Localization of these calcifications could
be determined with dedicated neck or cervical spine CT if clinically
indicated.

## 2023-07-28 ENCOUNTER — Telehealth: Payer: Self-pay | Admitting: Family Medicine

## 2023-07-28 NOTE — Telephone Encounter (Signed)
 Pt dropped off and paid $29 form fee to have FMLA filled out, so that he can help take care of his dad Bronsyn Shappell, 12/24/1943).  Will put form in Cath[employer] box.

## 2023-07-29 NOTE — Telephone Encounter (Signed)
Aware ppw ready for pickup

## 2023-07-29 NOTE — Telephone Encounter (Signed)
 NA/VM FULL FMLA ppw ready, no fax # to send it too though, will have to be picked up.

## 2023-08-07 ENCOUNTER — Ambulatory Visit: Payer: BC Managed Care – PPO | Admitting: Family Medicine

## 2023-08-07 ENCOUNTER — Encounter: Payer: Self-pay | Admitting: Family Medicine

## 2023-08-09 NOTE — Progress Notes (Signed)
 Not seen

## 2023-08-29 ENCOUNTER — Other Ambulatory Visit: Payer: Self-pay | Admitting: Family Medicine

## 2023-08-29 DIAGNOSIS — E559 Vitamin D deficiency, unspecified: Secondary | ICD-10-CM

## 2023-09-19 ENCOUNTER — Other Ambulatory Visit: Payer: Self-pay | Admitting: Family Medicine

## 2023-09-19 DIAGNOSIS — I10 Essential (primary) hypertension: Secondary | ICD-10-CM

## 2023-11-25 DIAGNOSIS — M21862 Other specified acquired deformities of left lower leg: Secondary | ICD-10-CM | POA: Diagnosis not present

## 2023-11-25 DIAGNOSIS — M722 Plantar fascial fibromatosis: Secondary | ICD-10-CM | POA: Diagnosis not present

## 2023-12-04 ENCOUNTER — Encounter: Payer: Self-pay | Admitting: Family Medicine

## 2023-12-04 ENCOUNTER — Ambulatory Visit (INDEPENDENT_AMBULATORY_CARE_PROVIDER_SITE_OTHER): Admitting: Family Medicine

## 2023-12-04 VITALS — BP 126/86 | HR 62 | Temp 97.0°F | Ht 74.0 in | Wt 294.2 lb

## 2023-12-04 DIAGNOSIS — M21969 Unspecified acquired deformity of unspecified lower leg: Secondary | ICD-10-CM | POA: Insufficient documentation

## 2023-12-04 DIAGNOSIS — E782 Mixed hyperlipidemia: Secondary | ICD-10-CM

## 2023-12-04 DIAGNOSIS — E559 Vitamin D deficiency, unspecified: Secondary | ICD-10-CM | POA: Diagnosis not present

## 2023-12-04 DIAGNOSIS — M21862 Other specified acquired deformities of left lower leg: Secondary | ICD-10-CM | POA: Insufficient documentation

## 2023-12-04 DIAGNOSIS — M6289 Other specified disorders of muscle: Secondary | ICD-10-CM | POA: Insufficient documentation

## 2023-12-04 DIAGNOSIS — I10 Essential (primary) hypertension: Secondary | ICD-10-CM | POA: Diagnosis not present

## 2023-12-04 DIAGNOSIS — M722 Plantar fascial fibromatosis: Secondary | ICD-10-CM | POA: Insufficient documentation

## 2023-12-04 MED ORDER — LISINOPRIL-HYDROCHLOROTHIAZIDE 20-12.5 MG PO TABS
2.0000 | ORAL_TABLET | Freq: Every day | ORAL | 1 refills | Status: AC
Start: 2023-12-04 — End: ?

## 2023-12-04 NOTE — Progress Notes (Signed)
 Subjective:  Patient ID: Spencer Anderson, male    DOB: 22-Jan-1971, 53 y.o.   MRN: [id number]  Patient Care Team: Severa [place] HERO, FNP as PCP - General (Family Medicine)   Chief Complaint:  Medical Management of Chronic Issues (6 month follow up )   HPI: Spencer Anderson is a 53 y.o. male presenting on 12/04/2023 for Medical Management of Chronic Issues (6 month follow up )   Spencer Anderson is a 53 year old male who presents for a regular checkup.  He experiences significant pain in his left foot, describing it as feeling like 'an inch long gash in the bottom of my foot with four stitches.' Approximately nine days ago, he visited a podiatrist who administered an injection, likely a steroid with lidocaine, but it did not alleviate the pain. He was also prescribed prednisone  and meloxicam. An x-ray was performed, and he is scheduled for a follow-up in a month. He has not yet obtained orthotics due to cost concerns.  He is currently taking lisinopril  HCTZ for hypertension. No headaches, chest pain, leg swelling, or vision changes. He experiences a feeling of being 'run down' for several hours after taking his blood pressure medication, which he now takes upon waking to avoid nocturnal urination.  He mentions a recent weight gain, from 287 pounds in February to 294 pounds currently. He attributes some of this to increased alcohol consumption following the death of his pet from lung cancer. He drinks two to three 20-ounce cans of light beer on weekends. He is not on any medication for cholesterol and finds it challenging to maintain a healthy diet due to his work schedule and aversion to salads.  He continues to take vitamin D  regularly and occasionally takes B12, with the last dose being about a week ago. He finds it difficult to maintain a healthy diet, citing a lack of time and options, and a distaste for certain healthy foods like avocados.       Relevant past medical, surgical, family, and  social history reviewed and updated as indicated.  Allergies and medications reviewed and updated. Data reviewed: Chart in Epic.   Past Medical History:  Diagnosis Date   Hypertension     History reviewed. No pertinent surgical history.  Social History   Socioeconomic History   Marital status: Divorced    Spouse name: Not on file   Number of children: Not on file   Years of education: Not on file   Highest education level: Not on file  Occupational History   Not on file  Tobacco Use   Smoking status: Former    Current packs/day: 0.00    Average packs/day: 1 pack/day for 22.0 years (22.0 ttl pk-yrs)    Types: Cigarettes    Start date: 01/11/1986    Quit date: 01/12/2008    Years since quitting: 15.9    Passive exposure: Past   Smokeless tobacco: Never  Vaping Use   Vaping status: Never Used  Substance and Sexual Activity   Alcohol use: Not Currently   Drug use: Never   Sexual activity: Not Currently  Other Topics Concern   Not on file  Social History Narrative   Not on file   Social Drivers of Health   Financial Resource Strain: Not on file  Food Insecurity: Not on file  Transportation Needs: Not on file  Physical Activity: Not on file  Stress: Not on file  Social Connections: Not on file  Intimate Partner Violence: Not on  file    Outpatient Encounter Medications as of 12/04/2023  Medication Sig   cyanocobalamin  1000 MCG tablet Take by mouth.   meloxicam (MOBIC) 15 MG tablet Take 15 mg by mouth daily.   Vitamin D , Ergocalciferol , (DRISDOL ) 1.25 MG (50000 UNIT) CAPS capsule TAKE 1 CAPSULE (50,000 UNITS TOTAL) BY MOUTH EVERY 7 (SEVEN) DAYS   [DISCONTINUED] lisinopril -hydrochlorothiazide  (ZESTORETIC ) 20-12.5 MG tablet Take 2 tablets by mouth daily.   lisinopril -hydrochlorothiazide  (ZESTORETIC ) 20-12.5 MG tablet Take 2 tablets by mouth daily.   No facility-administered encounter medications on file as of 12/04/2023.    No Known Allergies  Pertinent ROS per  HPI, otherwise unremarkable      Objective:  BP 126/86   Pulse 62   Temp (!) 97 F (36.1 C)   Ht 6' 2 (1.88 m)   Wt 294 lb 3.2 oz (133.4 kg)   SpO2 97%   BMI 37.77 kg/m    Wt Readings from Last 3 Encounters:  12/04/23 294 lb 3.2 oz (133.4 kg)  06/05/23 287 lb 9.6 oz (130.5 kg)  12/12/22 285 lb (129.3 kg)    Physical Exam Vitals and nursing note reviewed.  Constitutional:      General: He is not in acute distress.    Appearance: Normal appearance. He is morbidly obese. He is not ill-appearing, toxic-appearing or diaphoretic.  HENT:     Head: Normocephalic and atraumatic.     Right Ear: Tympanic membrane, ear canal and external ear normal.     Left Ear: Tympanic membrane, ear canal and external ear normal.     Nose: Nose normal.     Mouth/Throat:     Mouth: Mucous membranes are moist.     Pharynx: Oropharynx is clear. No posterior oropharyngeal erythema.  Eyes:     Conjunctiva/sclera: Conjunctivae normal.     Pupils: Pupils are equal, round, and reactive to light.  Cardiovascular:     Rate and Rhythm: Normal rate and regular rhythm.     Heart sounds: Normal heart sounds.  Pulmonary:     Effort: Pulmonary effort is normal.     Breath sounds: Normal breath sounds.  Musculoskeletal:     Cervical back: Neck supple.     Right lower leg: No edema.     Left lower leg: No edema.  Skin:    General: Skin is warm and dry.     Capillary Refill: Capillary refill takes less than 2 seconds.     Comments: Vitiligo   Neurological:     General: No focal deficit present.     Mental Status: He is alert and oriented to person, place, and time.  Psychiatric:        Mood and Affect: Mood normal.        Behavior: Behavior normal. Behavior is cooperative.        Thought Content: Thought content normal.        Judgment: Judgment normal.    Results for orders placed or performed in visit on 06/05/23  CMP14+EGFR   Collection Time: 06/05/23  8:41 AM  Result Value Ref Range    Glucose 88 70 - 99 mg/dL   BUN 14 6 - 24 mg/dL   Creatinine, Ser 8.87 0.76 - 1.27 mg/dL   eGFR 80 >40 fO/fpw/8.26   BUN/Creatinine Ratio 13 9 - 20   Sodium 143 134 - 144 mmol/L   Potassium 3.6 3.5 - 5.2 mmol/L   Chloride 101 96 - 106 mmol/L   CO2 25 20 - 29 mmol/L  Calcium  9.6 8.7 - 10.2 mg/dL   Total Protein 7.1 6.0 - 8.5 g/dL   Albumin 4.6 4.1 - 5.1 g/dL   Globulin, Total 2.5 1.5 - 4.5 g/dL   Bilirubin Total 0.2 0.0 - 1.2 mg/dL   Alkaline Phosphatase 89 44 - 121 IU/L   AST 32 0 - 40 IU/L   ALT 75 (H) 0 - 44 IU/L  CBC with Differential/Platelet   Collection Time: 06/05/23  8:41 AM  Result Value Ref Range   WBC 8.4 3.4 - 10.8 x10E3/uL   RBC 4.54 4.14 - 5.80 x10E6/uL   Hemoglobin 13.8 13.0 - 17.7 g/dL   Hematocrit 58.7 62.4 - 51.0 %   MCV 91 79 - 97 fL   MCH 30.4 26.6 - 33.0 pg   MCHC 33.5 31.5 - 35.7 g/dL   RDW 87.3 88.3 - 84.5 %   Platelets 278 150 - 450 x10E3/uL   Neutrophils 52 Not Estab. %   Lymphs 35 Not Estab. %   Monocytes 8 Not Estab. %   Eos 3 Not Estab. %   Basos 1 Not Estab. %   Neutrophils Absolute 4.4 1.4 - 7.0 x10E3/uL   Lymphocytes Absolute 2.9 0.7 - 3.1 x10E3/uL   Monocytes Absolute 0.7 0.1 - 0.9 x10E3/uL   EOS (ABSOLUTE) 0.3 0.0 - 0.4 x10E3/uL   Basophils Absolute 0.1 0.0 - 0.2 x10E3/uL   Immature Granulocytes 1 Not Estab. %   Immature Grans (Abs) 0.1 0.0 - 0.1 x10E3/uL  Lipid panel   Collection Time: 06/05/23  8:41 AM  Result Value Ref Range   Cholesterol, Total 195 100 - 199 mg/dL   Triglycerides 861 0 - 149 mg/dL   HDL 39 (L) >60 mg/dL   VLDL Cholesterol Cal 25 5 - 40 mg/dL   LDL Chol Calc (NIH) 868 (H) 0 - 99 mg/dL   Chol/HDL Ratio 5.0 0.0 - 5.0 ratio  Thyroid  Panel With TSH   Collection Time: 06/05/23  8:41 AM  Result Value Ref Range   TSH 2.540 0.450 - 4.500 uIU/mL   T4, Total 6.9 4.5 - 12.0 ug/dL   T3 Uptake Ratio 25 24 - 39 %   Free Thyroxine Index 1.7 1.2 - 4.9  VITAMIN D  25 Hydroxy (Vit-D Deficiency, Fractures)   Collection Time:  06/05/23  8:41 AM  Result Value Ref Range   Vit D, 25-Hydroxy 21.8 (L) 30.0 - 100.0 ng/mL  Vitamin B12   Collection Time: 06/05/23  8:41 AM  Result Value Ref Range   Vitamin B-12 661 232 - 1,245 pg/mL       Pertinent labs & imaging results that were available during my care of the patient were reviewed by me and considered in my medical decision making.  Assessment & Plan:  Abhi was seen today for medical management of chronic issues.  Diagnoses and all orders for this visit:  Essential hypertension -     lisinopril -hydrochlorothiazide  (ZESTORETIC ) 20-12.5 MG tablet; Take 2 tablets by mouth daily. -     Lipid panel -     CMP14+EGFR  Vitamin D  deficiency -     Vitamin D , 25-hydroxy -     CMP14+EGFR  Mixed hyperlipidemia -     Lipid panel -     CMP14+EGFR  Obesity, morbid (HCC) -     Vitamin D , 25-hydroxy -     Lipid panel -     CMP14+EGFR  Plantar fibromatosis Followed by podiatry, aware to keep follow up appointments.      Left  foot pain Chronic left foot pain, described as feeling like a gash with stitches. Recent treatment by a podiatrist included an injection of Kenalog and lidocaine, and prescription of meloxicam and prednisone . The injection did not provide relief. Orthotics were discussed but not pursued due to cost concerns. Discussed potential cost-effectiveness and insurance coverage for orthotics. - Follow up with podiatrist in one month - Discuss orthotics with podiatrist, including potential insurance coverage and cost-effectiveness  Hypertension Hypertension is well-controlled with lisinopril  HCTZ. No reported side effects except feeling run down for a few hours after taking medication. No headaches, chest pain, or leg swelling reported. Plan to check renal function due to potential effects of lisinopril , hydrochlorothiazide , and meloxicam on kidneys. - Continue lisinopril  HCTZ - Check renal function due to potential effects of lisinopril ,  hydrochlorothiazide , and meloxicam on kidneys  Obesity Weight has increased from 287 lbs in February to 294 lbs currently. Challenges with dietary management due to lifestyle and recent emotional stress. Discussed dietary options beyond salads, including lean proteins and high-protein foods. - Provide dietary handout for calorie deficit and meal planning - Encourage exercise at least five days a week  Alcohol use Currently consuming 2-3 twenty-ounce cans of light beer on weekend nights. - Check liver function tests          Continue all other maintenance medications.  Follow up plan: Return in about 6 months (around 06/05/2024), or if symptoms worsen or fail to improve, for Annual Physical.   Continue healthy lifestyle choices, including diet (rich in fruits, vegetables, and lean proteins, and low in salt and simple carbohydrates) and exercise (at least 30 minutes of moderate physical activity daily).  Educational handout given for DASH  The above assessment and management plan was discussed with the patient. The patient verbalized understanding of and has agreed to the management plan. Patient is aware to call the clinic if they develop any new symptoms or if symptoms persist or worsen. Patient is aware when to return to the clinic for a follow-up visit. Patient educated on when it is appropriate to go to the emergency department.   Rosaline Bruns, FNP-C [facility] [phone]

## 2023-12-04 NOTE — Patient Instructions (Signed)

## 2023-12-05 LAB — CMP14+EGFR
ALT: 75 IU/L — ABNORMAL HIGH (ref 0–44)
AST: 36 IU/L (ref 0–40)
Albumin: 4.4 g/dL (ref 3.8–4.9)
Alkaline Phosphatase: 72 IU/L (ref 44–121)
BUN/Creatinine Ratio: 14 (ref 9–20)
BUN: 14 mg/dL (ref 6–24)
Bilirubin Total: 0.3 mg/dL (ref 0.0–1.2)
CO2: 25 mmol/L (ref 20–29)
Calcium: 9.2 mg/dL (ref 8.7–10.2)
Chloride: 103 mmol/L (ref 96–106)
Creatinine, Ser: 0.99 mg/dL (ref 0.76–1.27)
Globulin, Total: 2.2 g/dL (ref 1.5–4.5)
Glucose: 92 mg/dL (ref 70–99)
Potassium: 4.3 mmol/L (ref 3.5–5.2)
Sodium: 144 mmol/L (ref 134–144)
Total Protein: 6.6 g/dL (ref 6.0–8.5)
eGFR: 92 mL/min/1.73 (ref >59)

## 2023-12-05 LAB — LIPID PANEL
Chol/HDL Ratio: 4.1 ratio (ref 0.0–5.0)
Cholesterol, Total: 177 mg/dL (ref 100–199)
HDL: 43 mg/dL (ref >39)
LDL Chol Calc (NIH): 111 mg/dL — ABNORMAL HIGH (ref 0–99)
Triglycerides: 126 mg/dL (ref 0–149)
VLDL Cholesterol Cal: 23 mg/dL (ref 5–40)

## 2023-12-05 LAB — VITAMIN D 25 HYDROXY (VIT D DEFICIENCY, FRACTURES): Vit D, 25-Hydroxy: 31.7 ng/mL (ref 30.0–100.0)

## 2023-12-08 ENCOUNTER — Ambulatory Visit: Payer: Self-pay | Admitting: Family Medicine

## 2023-12-08 DIAGNOSIS — R7989 Other specified abnormal findings of blood chemistry: Secondary | ICD-10-CM

## 2023-12-16 ENCOUNTER — Telehealth: Payer: Self-pay

## 2023-12-16 NOTE — Telephone Encounter (Signed)
 Copied from CRM #[id number]. Topic: Clinical - Lab/Test Results >> Dec 16, 2023 12:50 PM Nathanel BROCKS wrote: Reason for CRM: pt called and I advised him of his lab work and also about the US . Pt states that those levels has been elevated for quite some time and feels that an US  is just not needed at this point. Please advise pt if otherwise.

## 2023-12-16 NOTE — Telephone Encounter (Signed)
 Message sent on lab results

## 2023-12-16 NOTE — Telephone Encounter (Signed)
 2nd attempt to contact patient to give lab results. No answer and voicemail is full. Looks like someone made him an appt to have US  done at [facility] on 9/4. Need to confirm that patient is aware of this also.

## 2023-12-17 ENCOUNTER — Ambulatory Visit (HOSPITAL_COMMUNITY): Admission: RE | Admit: 2023-12-17 | Source: Ambulatory Visit

## 2024-01-15 ENCOUNTER — Other Ambulatory Visit: Payer: Self-pay | Admitting: Family Medicine

## 2024-01-15 DIAGNOSIS — E559 Vitamin D deficiency, unspecified: Secondary | ICD-10-CM

## 2024-03-13 DIAGNOSIS — J0140 Acute pansinusitis, unspecified: Secondary | ICD-10-CM | POA: Diagnosis not present

## 2024-06-07 ENCOUNTER — Encounter: Admitting: Family Medicine

## 2024-06-10 ENCOUNTER — Encounter: Payer: Self-pay | Admitting: Family Medicine
